# Patient Record
Sex: Male | Born: 1990
Health system: Southern US, Community
[De-identification: ages and names within clinical notes are randomized; demographics above are authoritative.]

## PROBLEM LIST (undated history)

## (undated) DIAGNOSIS — K219 Gastro-esophageal reflux disease without esophagitis: Secondary | ICD-10-CM

## (undated) DIAGNOSIS — I10 Essential (primary) hypertension: Secondary | ICD-10-CM

## (undated) DIAGNOSIS — E039 Hypothyroidism, unspecified: Secondary | ICD-10-CM

## (undated) HISTORY — DX: Essential (primary) hypertension: I10

## (undated) HISTORY — DX: Gastro-esophageal reflux disease without esophagitis: K21.9

## (undated) HISTORY — DX: Hypothyroidism, unspecified: E03.9

---

## 2014-01-02 ENCOUNTER — Encounter (HOSPITAL_BASED_OUTPATIENT_CLINIC_OR_DEPARTMENT_OTHER): Payer: Self-pay | Admitting: Emergency Medicine

## 2014-01-02 ENCOUNTER — Emergency Department (HOSPITAL_BASED_OUTPATIENT_CLINIC_OR_DEPARTMENT_OTHER)
Admission: EM | Admit: 2014-01-02 | Discharge: 2014-01-02 | Disposition: A | Discharge status: Home / Self Care | Payer: Worker's Compensation | Attending: Emergency Medicine | Admitting: Emergency Medicine

## 2014-01-02 ENCOUNTER — Emergency Department (HOSPITAL_BASED_OUTPATIENT_CLINIC_OR_DEPARTMENT_OTHER): Payer: Worker's Compensation

## 2014-01-02 DIAGNOSIS — Z4689 Encounter for fitting and adjustment of other specified devices: Secondary | ICD-10-CM | POA: Insufficient documentation

## 2014-01-02 DIAGNOSIS — Z79899 Other long term (current) drug therapy: Secondary | ICD-10-CM | POA: Diagnosis not present

## 2014-01-02 DIAGNOSIS — M79609 Pain in unspecified limb: Secondary | ICD-10-CM | POA: Diagnosis present

## 2014-01-02 DIAGNOSIS — S86891A Other injury of other muscle(s) and tendon(s) at lower leg level, right leg, initial encounter: Secondary | ICD-10-CM

## 2014-01-02 MED ORDER — NAPROXEN 500 MG PO TABS: 500.0000 mg | ORAL_TABLET | Freq: Two times a day (BID) | ORAL | Status: DC

## 2014-01-02 NOTE — ED Provider Notes (Signed)
CSN: [id number]     Arrival date & time 01/02/14  8295 History   First MD Initiated Contact with Patient 01/02/14 1029     Chief Complaint  Patient presents with  . Foot Pain    HPI Pt presents to the ED with pain in his foot that started yesterday.  No injury.  The pain is short on the top of his midfoot towards his toes.  Increases with bearing weight.  No fever.   No recent increase in activity.  No fevers or swelling. History reviewed. No pertinent past medical history. History reviewed. No pertinent past surgical history. No family history on file. History  Substance Use Topics  . Smoking status: Never Smoker   . Smokeless tobacco: Not on file  . Alcohol Use: Yes    Review of Systems  All other systems reviewed and are negative.     Allergies  Review of patient's allergies indicates no known allergies.  Home Medications   Prior to Admission medications   Medication Sig Start Date End Date Taking? Authorizing Provider  Multiple Vitamins-Minerals (MULTIVITAMIN WITH MINERALS) tablet Take 1 tablet by mouth daily.   Yes Historical Provider, MD  naproxen (NAPROSYN) 500 MG tablet Take 1 tablet (500 mg total) by mouth 2 (two) times daily. 01/02/14   Linwood Dibbles, MD   BP 139/74  Pulse 63  Temp(Src) 97.8 F (36.6 C) (Oral)  Resp 18  Ht  (1.778 m)  Wt 170 lb (77.111 kg)  BMI 24.39 kg/m2  SpO2 100% Physical Exam  Nursing note and vitals reviewed. Constitutional: He appears well-developed and well-nourished. No distress.  HENT:  Head: Normocephalic and atraumatic.  Right Ear: External ear normal.  Left Ear: External ear normal.  Eyes: Conjunctivae are normal. Right eye exhibits no discharge. Left eye exhibits no discharge. No scleral icterus.  Neck: Neck supple. No tracheal deviation present.  Cardiovascular: Normal rate.   Pulmonary/Chest: Effort normal. No stridor. No respiratory distress.  Musculoskeletal: He exhibits no edema.       Left foot: He exhibits bony  tenderness. He exhibits no swelling and no deformity.       Feet:  Mild ttp anterior aspect of shin adjacent to tibia bone, no erythema   Neurological: He is alert. Cranial nerve deficit: no gross deficits.  Skin: Skin is warm and dry. No rash noted.  Psychiatric: He has a normal mood and affect.    ED Course  Procedures (including critical care time) Labs Review Labs Reviewed - No data to display  Imaging Review Dg Ankle Complete Left  01/02/2014   CLINICAL DATA:  Left ankle pain. No trauma. Increases with weight-bearing.  EXAM: LEFT ANKLE COMPLETE - 3+ VIEW  COMPARISON:  None.  FINDINGS: There is no evidence of fracture, dislocation, or joint effusion. There is no evidence of arthropathy or other focal bone abnormality. Soft tissues are unremarkable.  IMPRESSION: Negative.   Electronically Signed   By: Annia Belt M.D.   On: 01/02/2014 10:55     EKG Interpretation None      MDM   Final diagnoses:  Shin splints, right, initial encounter    No stress fracture noted on xray.  Symptoms do increase with dorsiflexion of foot.  Suspect shin splints as the cause of his pain.  Will rx nsaids.  Rest.  Follow up sports med as needed    Linwood Dibbles, MD 01/02/14 1119

## 2014-01-02 NOTE — ED Notes (Signed)
Pt discharged to home NAD.  

## 2014-01-02 NOTE — ED Notes (Signed)
Patient c/o L foot pain, shooting pain when he tries to bear weight, no injury that he is not aware of any injury

## 2014-01-02 NOTE — Discharge Instructions (Signed)
Shin Splints Shin splints is a painful condition that is felt on the shinbone or in the muscles on either side of the bone (front of your lower leg). Shin splints happen when physical activities, such as sports or other demanding exercise, leads to inflammation of the muscles, tendons, and the thin layer that covers the shinbone.  CAUSES   Overuse of muscles.  Repetitive activities.  Flat feet or rigid arches. Activities that could contribute to shin splints include:  A sudden increase in exercise time.  Starting a new, demanding activity.  Running up hills or long distances.  Playing sports with sudden starts and stops.  A poor warm up.  Old or worn-out shoes. SYMPTOMS   Pain on the front of the leg.  Pain while exercising or at rest. DIAGNOSIS  Your caregiver will diagnose shin splints from a history of your symptoms and a physical exam. You may be observed as you walk or run. X-ray exams or further testing may be needed to rule out other problems, such as a stress fracture, which also causes lower leg pain. TREATMENT  Your caregiver may decide on the treatment based on your age, history, health, and how bad the pain is. Most cases of shin splints can be managed by one or more of the following:  Resting.  Reducing the length and intensity of your exercise.  Stopping the activity that causes shin pain.  Taking medicines to control the inflammation.  Icing, massaging, stretching, and strengthening the affected area.  Getting shoes with rigid heels, shock absorption, and a good arch support. HOME CARE INSTRUCTIONS   Resume activity steadily or as directed by your caregiver.  Restart your exercise sessions with non-weight-bearing exercises, such as cycling or swimming.  Stop running if the pain returns.  Warm up properly before exercising.  Run on a level and fairly firm surface.  Gradually change the intensity of an exercise.  Limit increases in running  distance by no more than 5 to 10% weekly. This means if you are running 5 miles, you can only increase your run by 1/2 a mile at a time.  Change your athletic shoes every 6 months, or every 350 to 450 miles. SEEK MEDICAL CARE IF:   Symptoms continue or worsen even after treatment.  The location, intensity, or type of pain changes over time. SEEK IMMEDIATE MEDICAL CARE IF:   You have severe pain.  You have trouble walking. MAKE SURE YOU:  Understand these instructions.  Will watch your condition.  Will get help right away if you are not doing well or get worse. Document Released: 03/29/2000 Document Revised: 06/24/2011 Document Reviewed: 09/16/2010 Exit[employer] Patient Information 2015 [employer]. This information is not intended to replace advice given to you by your health care provider. Make sure you discuss any questions you have with your health care provider.  

## 2014-02-08 DIAGNOSIS — E785 Hyperlipidemia, unspecified: Secondary | ICD-10-CM | POA: Insufficient documentation

## 2014-10-26 IMAGING — CR DG ANKLE COMPLETE 3+V*L*
3 series · 3 of 3 positions shown · non-contrast
Comparison: None.

CLINICAL DATA: Left ankle pain. No trauma. Increases with
weight-bearing.

EXAM:
LEFT ANKLE COMPLETE - 3+ VIEW

[t ankle joint ap left]
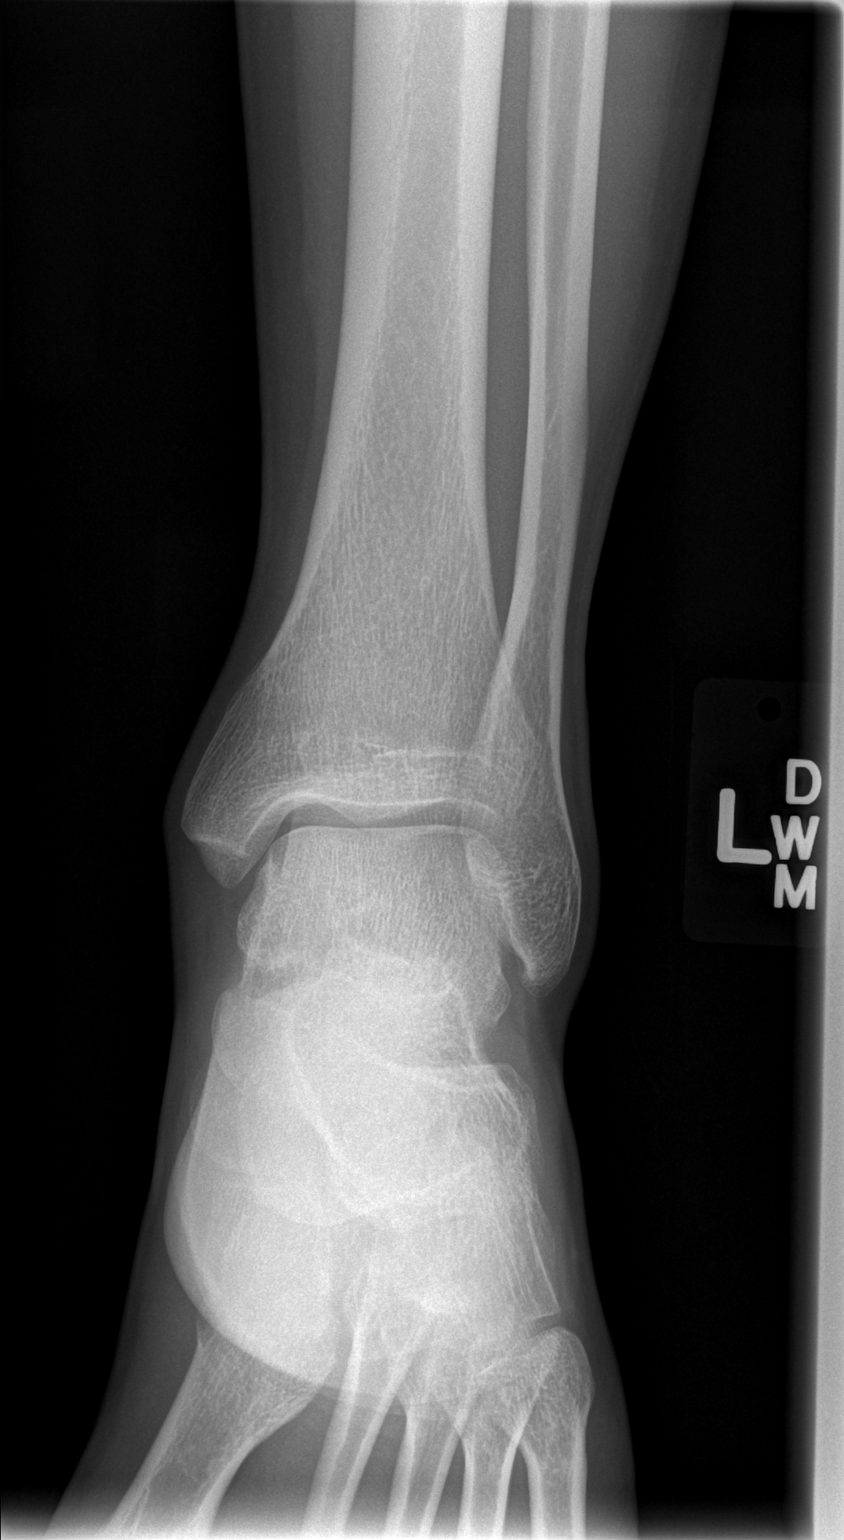

[t ankle joint oblique left]
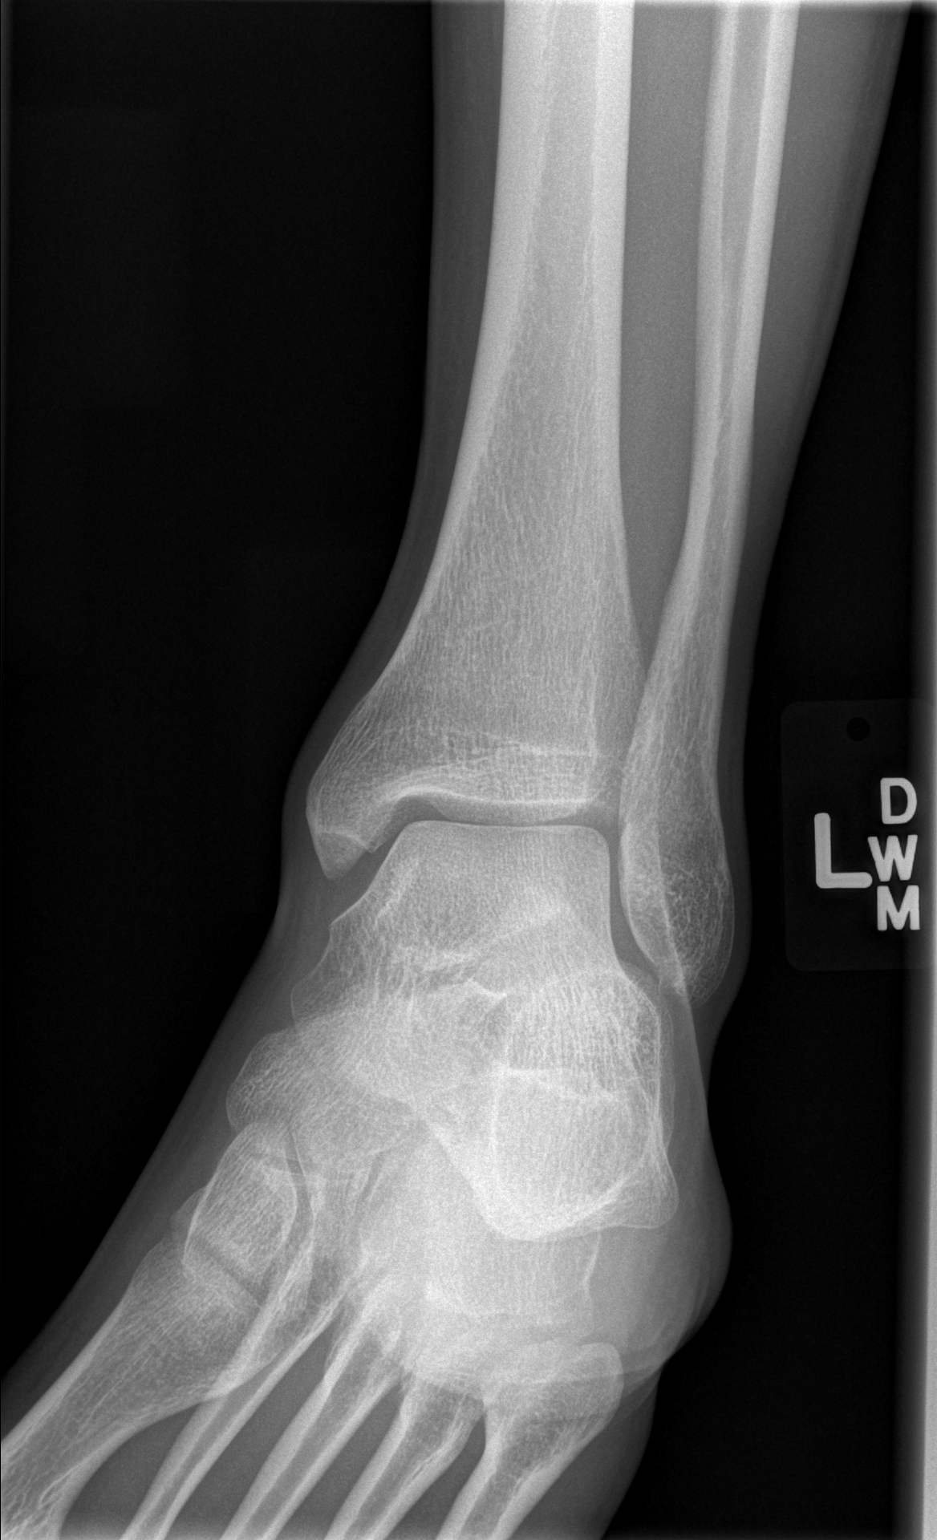

[t ankle joint lat left]
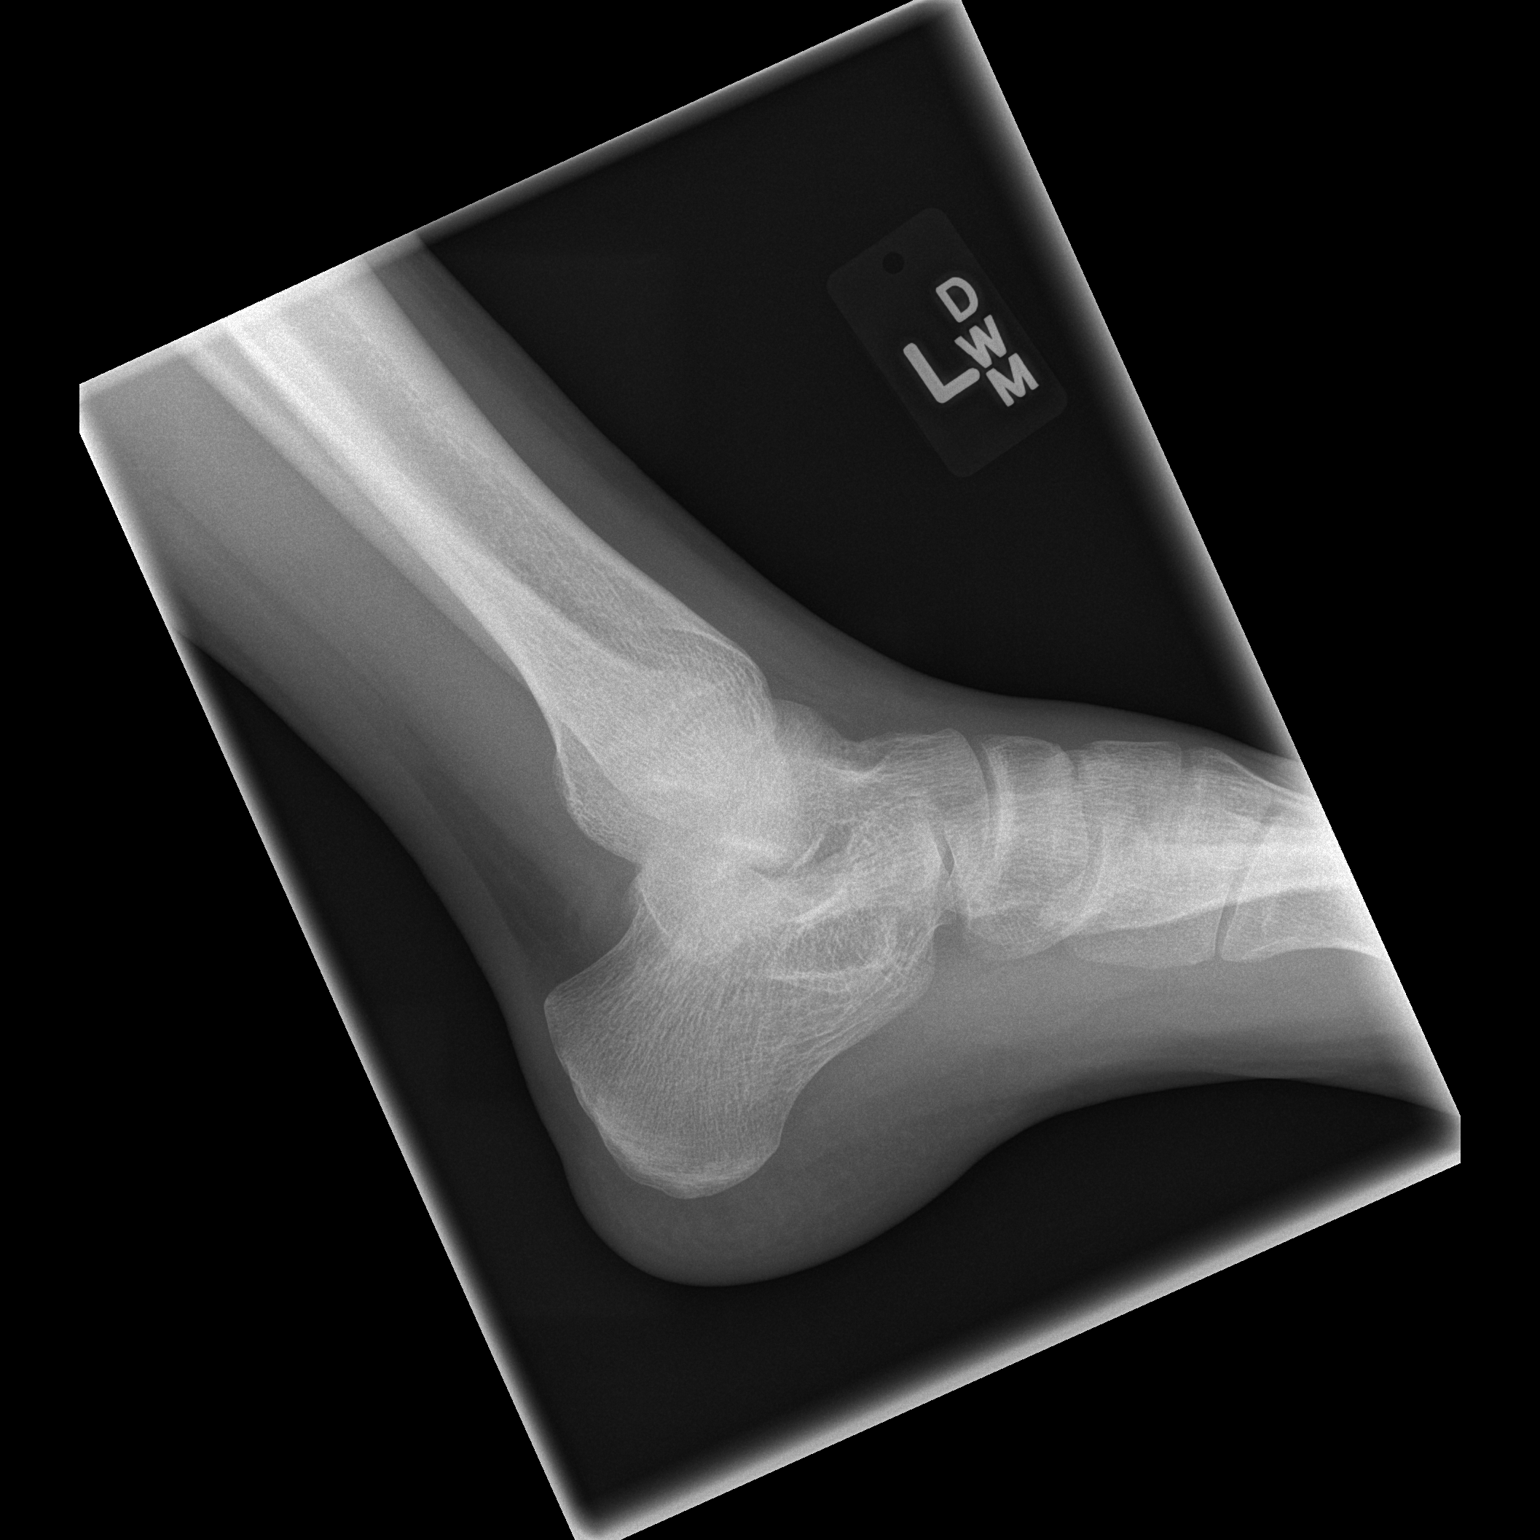

[3 of 3 positions shown; findings below may reference images not displayed]

FINDINGS: There is no evidence of fracture, dislocation, or joint effusion.
There is no evidence of arthropathy or other focal bone abnormality.
Soft tissues are unremarkable.
IMPRESSION: Negative.

## 2016-07-11 DIAGNOSIS — K219 Gastro-esophageal reflux disease without esophagitis: Secondary | ICD-10-CM | POA: Diagnosis not present

## 2016-07-11 DIAGNOSIS — E034 Atrophy of thyroid (acquired): Secondary | ICD-10-CM | POA: Diagnosis not present

## 2016-08-30 DIAGNOSIS — R945 Abnormal results of liver function studies: Secondary | ICD-10-CM | POA: Diagnosis not present

## 2016-08-30 DIAGNOSIS — J018 Other acute sinusitis: Secondary | ICD-10-CM | POA: Diagnosis not present

## 2017-01-08 DIAGNOSIS — J0100 Acute maxillary sinusitis, unspecified: Secondary | ICD-10-CM | POA: Diagnosis not present

## 2017-02-19 DIAGNOSIS — K219 Gastro-esophageal reflux disease without esophagitis: Secondary | ICD-10-CM | POA: Diagnosis not present

## 2017-02-19 DIAGNOSIS — Z23 Encounter for immunization: Secondary | ICD-10-CM | POA: Diagnosis not present

## 2017-02-19 DIAGNOSIS — I10 Essential (primary) hypertension: Secondary | ICD-10-CM | POA: Diagnosis not present

## 2017-02-19 DIAGNOSIS — E034 Atrophy of thyroid (acquired): Secondary | ICD-10-CM | POA: Diagnosis not present

## 2017-05-01 DIAGNOSIS — I10 Essential (primary) hypertension: Secondary | ICD-10-CM | POA: Diagnosis not present

## 2017-05-13 DIAGNOSIS — I10 Essential (primary) hypertension: Secondary | ICD-10-CM | POA: Diagnosis not present

## 2017-06-05 DIAGNOSIS — J018 Other acute sinusitis: Secondary | ICD-10-CM | POA: Diagnosis not present

## 2017-06-06 DIAGNOSIS — I10 Essential (primary) hypertension: Secondary | ICD-10-CM | POA: Diagnosis not present

## 2017-06-06 DIAGNOSIS — I517 Cardiomegaly: Secondary | ICD-10-CM | POA: Diagnosis not present

## 2017-06-11 ENCOUNTER — Encounter: Payer: Self-pay | Admitting: Cardiology

## 2017-06-11 DIAGNOSIS — K219 Gastro-esophageal reflux disease without esophagitis: Secondary | ICD-10-CM | POA: Insufficient documentation

## 2017-06-11 DIAGNOSIS — I10 Essential (primary) hypertension: Secondary | ICD-10-CM | POA: Insufficient documentation

## 2017-06-11 DIAGNOSIS — E034 Atrophy of thyroid (acquired): Secondary | ICD-10-CM | POA: Insufficient documentation

## 2017-06-18 ENCOUNTER — Encounter: Payer: Self-pay | Admitting: Cardiology

## 2017-06-18 ENCOUNTER — Ambulatory Visit (INDEPENDENT_AMBULATORY_CARE_PROVIDER_SITE_OTHER): Payer: 59 | Admitting: Cardiology

## 2017-06-18 VITALS — BP 124/80 | HR 91 | Ht 70.0 in | Wt 195.0 lb

## 2017-06-18 DIAGNOSIS — E785 Hyperlipidemia, unspecified: Secondary | ICD-10-CM

## 2017-06-18 DIAGNOSIS — I10 Essential (primary) hypertension: Secondary | ICD-10-CM | POA: Diagnosis not present

## 2017-06-18 DIAGNOSIS — E034 Atrophy of thyroid (acquired): Secondary | ICD-10-CM

## 2017-06-18 DIAGNOSIS — K219 Gastro-esophageal reflux disease without esophagitis: Secondary | ICD-10-CM

## 2017-06-18 DIAGNOSIS — R9439 Abnormal result of other cardiovascular function study: Secondary | ICD-10-CM | POA: Diagnosis not present

## 2017-06-18 NOTE — Progress Notes (Signed)
Cardiology Office Note:    Date:  06/18/2017   ID:  Carlos Smith, DOB 06-01-1990, MRN [id number]  PCP:  System, Pcp Not In  Cardiologist:  Garwin Brothers, MD   Referring MD: Gus Height, PA-C    ASSESSMENT:    1. Abnormal stress test   2. Atrophy of thyroid   3. Gastro-esophageal reflux disease without esophagitis   4. Essential hypertension    PLAN:    In order of problems listed above:  1. I reassured the patient about my findings today.  Lifestyle modifications for blood pressure issues were discussed.  I am not sure whether he has an element of high blood pressure but I mentioned to him to keep a track of his blood pressures at home in his normal environment and he is agreeable to this.  Fortunately I do not see any endorgan damage issues such as left ventricular hypertrophy and I am happy about it and and I ran it by him to make him feel good. 2. In view of abnormal stress test I will do a exercise stress echo to make sure that all is well with this gentleman. 3. For risk stratification we will do blood work including TSH and lipid profile will be done today as he is fasting. 4. Will be seen in follow-up appointment in 1 month or earlier if he has any concerns.  He knows to go to the nearest emergency room for any significant concerns.   Medication Adjustments/Labs and Tests Ordered: Current medicines are reviewed at length with the patient today.  Concerns regarding medicines are outlined above.  No orders of the defined types were placed in this encounter.  No orders of the defined types were placed in this encounter.    History of Present Illness:    Carlos Smith is a 27 y.o. male who is being seen today for the evaluation of essential hypertension and chest pain patient is a at the request of Gus Height, [place].  A pleasant 27 year old male with no significant past medical history.  Recently he has checked his blood pressure at time or 2 and mentions to me that  it might have been elevated.  This was under unusual circumstances mostly at the doctor's office.  He is fairly stressed about this.  Because of her family history was referred for stress testing which had abnormal EKG findings.  He denies any chest pain orthopnea or PND overall he has a knifelike stabbing sensation occasionally unrelated to exertion.  Because of all these issues he is pretty worried and concerned.  No syncope.  At the time of my evaluation, the patient is alert awake oriented and in no distress.  He feels dizzy at times.  Past Medical History:  Diagnosis Date  . Gastroesophageal reflux disease   . Hypertension   . Hypothyroidism     History reviewed. No pertinent surgical history.  Current Medications: Current Meds  Medication Sig  . levothyroxine (SYNTHROID, LEVOTHROID) 25 MCG tablet Take 25 mcg by mouth daily.  [name] lisinopril (PRINIVIL,ZESTRIL) 5 MG tablet Take 5 mg by mouth daily.  . Multiple Vitamin (MULTIVITAMIN) capsule Take by mouth.     Allergies:   Patient has no known allergies.   Social History   Socioeconomic History  . Marital status: Single    Spouse name: None  . Number of children: None  . Years of education: None  . Highest education level: None  Social Needs  . Financial resource strain: None  .  Food insecurity - worry: None  . Food insecurity - inability: None  . Transportation needs - medical: None  . Transportation needs - non-medical: None  Occupational History  . None  Tobacco Use  . Smoking status: Never Smoker  . Smokeless tobacco: Never Used  Substance and Sexual Activity  . Alcohol use: Yes  . Drug use: No  . Sexual activity: None  Other Topics Concern  . None  Social History Narrative  . None     Family History: The patient's family history includes Cerebrovascular Accident in his father; Hypertension in his brother, father, mother, and sister; Migraines in his father; Stroke in his father.  ROS:   Please see the  history of present illness.    All other systems reviewed and are negative.  EKGs/Labs/Other Studies Reviewed:    The following studies were reviewed today: I discussed my findings with him at extensive length.  EKG reveals sinus rhythm and nonspecific ST-T changes.   Recent Labs: No results found for requested labs within last 8760 hours.  Recent Lipid Panel No results found for: CHOL, TRIG, HDL, CHOLHDL, VLDL, LDLCALC, LDLDIRECT  Physical Exam:    VS:  BP 124/80 (BP Location: Right Arm, Patient Position: Sitting, Cuff Size: Normal)   Pulse 91   Ht 5\' 10"  (1.778 m)   Wt 195 lb (88.5 kg)   SpO2 98%   BMI 27.98 kg/m     Wt Readings from Last 3 Encounters:  06/18/17 195 lb (88.5 kg)  01/02/14 170 lb (77.1 kg)     GEN: Patient is in no acute distress HEENT: Normal NECK: No JVD; No carotid bruits LYMPHATICS: No lymphadenopathy CARDIAC: S1 S2 regular, 2/6 systolic murmur at the apex. RESPIRATORY:  Clear to auscultation without rales, wheezing or rhonchi  ABDOMEN: Soft, non-tender, non-distended MUSCULOSKELETAL:  No edema; No deformity  SKIN: Warm and dry NEUROLOGIC:  Alert and oriented x 3 PSYCHIATRIC:  Normal affect    Signed, Garwin Brothersajan R Revankar, MD  06/18/2017 9:38 AM    [facility]

## 2017-06-18 NOTE — Patient Instructions (Addendum)
Medication Instructions:  Your physician recommends that you continue on your current medications as directed. Please refer to the Current Medication list given to you today.  Labwork: Your physician recommends that you have the following labs drawn: BMP, CBC, TSH, MAG, liver and lipid panel  Testing/Procedures: Your physician has requested that you have a stress echocardiogram. For further information please visit [email or web address]. Please follow instruction sheet as given.  Follow-Up: Your physician recommends that you schedule a follow-up appointment in: 1 month  Any Other Special Instructions Will Be Listed Below (If Applicable).     If you need a refill on your cardiac medications before your next appointment, please call your pharmacy.   [facility]  Garey HamAshley A, RN, BSN

## 2017-06-19 LAB — BASIC METABOLIC PANEL
BUN/Creatinine Ratio: 13 (ref 9–20)
BUN: 8 mg/dL (ref 6–20)
CO2: 27 mmol/L (ref 20–29)
CREATININE: 0.63 mg/dL — AB (ref 0.76–1.27)
Calcium: 9.9 mg/dL (ref 8.7–10.2)
Chloride: 100 mmol/L (ref 96–106)
GFR calc Af Amer: 157 mL/min/{1.73_m2} (ref >59)
GFR calc non Af Amer: 136 mL/min/{1.73_m2} (ref >59)
Glucose: 83 mg/dL (ref 65–99)
Potassium: 4.7 mmol/L (ref 3.5–5.2)
Sodium: 141 mmol/L (ref 134–144)

## 2017-06-19 LAB — HEPATIC FUNCTION PANEL
ALBUMIN: 4.6 g/dL (ref 3.5–5.5)
ALK PHOS: 87 IU/L (ref 39–117)
ALT: 27 IU/L (ref 0–44)
AST: 17 IU/L (ref 0–40)
BILIRUBIN TOTAL: 0.4 mg/dL (ref 0.0–1.2)
BILIRUBIN, DIRECT: 0.13 mg/dL (ref 0.00–0.40)
Total Protein: 7.4 g/dL (ref 6.0–8.5)

## 2017-06-19 LAB — CBC WITH DIFFERENTIAL/PLATELET
BASOS ABS: 0 10*3/uL (ref 0.0–0.2)
Basos: 0 %
EOS (ABSOLUTE): 0.1 10*3/uL (ref 0.0–0.4)
Eos: 1 %
HEMOGLOBIN: 15.6 g/dL (ref 13.0–17.7)
Hematocrit: 46.1 % (ref 37.5–51.0)
Immature Grans (Abs): 0.1 10*3/uL (ref 0.0–0.1)
Immature Granulocytes: 1 %
Lymphocytes Absolute: 1.5 10*3/uL (ref 0.7–3.1)
Lymphs: 18 %
MCH: 30.5 pg (ref 26.6–33.0)
MCHC: 33.8 g/dL (ref 31.5–35.7)
MCV: 90 fL (ref 79–97)
MONOCYTES: 9 %
Monocytes Absolute: 0.7 10*3/uL (ref 0.1–0.9)
Neutrophils Absolute: 5.8 10*3/uL (ref 1.4–7.0)
Neutrophils: 71 %
PLATELETS: 245 10*3/uL (ref 150–379)
RBC: 5.12 x10E6/uL (ref 4.14–5.80)
RDW: 13.2 % (ref 12.3–15.4)
WBC: 8.1 10*3/uL (ref 3.4–10.8)

## 2017-06-19 LAB — MAGNESIUM: Magnesium: 2 mg/dL (ref 1.6–2.3)

## 2017-06-19 LAB — LIPID PANEL
CHOLESTEROL TOTAL: 163 mg/dL (ref 100–199)
Chol/HDL Ratio: 4.3 ratio (ref 0.0–5.0)
HDL: 38 mg/dL — ABNORMAL LOW (ref >39)
LDL Calculated: 97 mg/dL (ref 0–99)
Triglycerides: 140 mg/dL (ref 0–149)
VLDL Cholesterol Cal: 28 mg/dL (ref 5–40)

## 2017-06-19 LAB — TSH: TSH: 1.56 u[IU]/mL (ref 0.450–4.500)

## 2017-07-08 ENCOUNTER — Ambulatory Visit (HOSPITAL_BASED_OUTPATIENT_CLINIC_OR_DEPARTMENT_OTHER)
Admission: RE | Admit: 2017-07-08 | Discharge: 2017-07-08 | Disposition: A | Discharge status: Home / Self Care | Payer: 59 | Source: Ambulatory Visit | Attending: Cardiology | Admitting: Cardiology

## 2017-07-08 DIAGNOSIS — R9439 Abnormal result of other cardiovascular function study: Secondary | ICD-10-CM | POA: Insufficient documentation

## 2017-07-08 NOTE — Progress Notes (Signed)
Echocardiogram Echocardiogram Stress Test has been performed.  Dorothey BasemanReel, Bexton Haak M 07/08/2017, 11:47 AM

## 2017-07-16 ENCOUNTER — Other Ambulatory Visit (HOSPITAL_BASED_OUTPATIENT_CLINIC_OR_DEPARTMENT_OTHER): Payer: Self-pay

## 2017-07-18 ENCOUNTER — Ambulatory Visit: Payer: 59 | Admitting: Cardiology

## 2017-07-18 DIAGNOSIS — R0989 Other specified symptoms and signs involving the circulatory and respiratory systems: Secondary | ICD-10-CM

## 2017-09-17 DIAGNOSIS — E034 Atrophy of thyroid (acquired): Secondary | ICD-10-CM | POA: Diagnosis not present

## 2017-09-17 DIAGNOSIS — I10 Essential (primary) hypertension: Secondary | ICD-10-CM | POA: Diagnosis not present

## 2017-09-17 DIAGNOSIS — K219 Gastro-esophageal reflux disease without esophagitis: Secondary | ICD-10-CM | POA: Diagnosis not present

## 2017-10-29 DIAGNOSIS — J060 Acute laryngopharyngitis: Secondary | ICD-10-CM | POA: Diagnosis not present

## 2017-12-17 DIAGNOSIS — J018 Other acute sinusitis: Secondary | ICD-10-CM | POA: Diagnosis not present

## 2018-02-17 DIAGNOSIS — J018 Other acute sinusitis: Secondary | ICD-10-CM | POA: Diagnosis not present

## 2018-03-19 DIAGNOSIS — E034 Atrophy of thyroid (acquired): Secondary | ICD-10-CM | POA: Diagnosis not present

## 2018-03-19 DIAGNOSIS — Z23 Encounter for immunization: Secondary | ICD-10-CM | POA: Diagnosis not present

## 2018-03-19 DIAGNOSIS — K219 Gastro-esophageal reflux disease without esophagitis: Secondary | ICD-10-CM | POA: Diagnosis not present

## 2018-03-19 DIAGNOSIS — I10 Essential (primary) hypertension: Secondary | ICD-10-CM | POA: Diagnosis not present

## 2018-03-26 DIAGNOSIS — J342 Deviated nasal septum: Secondary | ICD-10-CM | POA: Diagnosis not present

## 2018-03-26 DIAGNOSIS — J3489 Other specified disorders of nose and nasal sinuses: Secondary | ICD-10-CM | POA: Diagnosis not present

## 2018-03-26 DIAGNOSIS — R0981 Nasal congestion: Secondary | ICD-10-CM | POA: Diagnosis not present

## 2018-04-20 DIAGNOSIS — J00 Acute nasopharyngitis [common cold]: Secondary | ICD-10-CM | POA: Diagnosis not present

## 2018-04-29 DIAGNOSIS — J329 Chronic sinusitis, unspecified: Secondary | ICD-10-CM | POA: Diagnosis not present

## 2018-05-05 DIAGNOSIS — J343 Hypertrophy of nasal turbinates: Secondary | ICD-10-CM | POA: Diagnosis not present

## 2018-05-05 DIAGNOSIS — R0981 Nasal congestion: Secondary | ICD-10-CM | POA: Diagnosis not present

## 2018-05-05 DIAGNOSIS — J342 Deviated nasal septum: Secondary | ICD-10-CM | POA: Diagnosis not present

## 2018-05-16 HISTORY — PX: NASAL SEPTUM SURGERY: SHX37

## 2018-05-20 DIAGNOSIS — J342 Deviated nasal septum: Secondary | ICD-10-CM | POA: Diagnosis not present

## 2018-05-20 DIAGNOSIS — R0981 Nasal congestion: Secondary | ICD-10-CM | POA: Diagnosis not present

## 2018-05-20 DIAGNOSIS — J343 Hypertrophy of nasal turbinates: Secondary | ICD-10-CM | POA: Diagnosis not present

## 2018-06-03 DIAGNOSIS — J320 Chronic maxillary sinusitis: Secondary | ICD-10-CM | POA: Diagnosis not present

## 2018-06-03 DIAGNOSIS — J343 Hypertrophy of nasal turbinates: Secondary | ICD-10-CM | POA: Diagnosis not present

## 2018-06-03 DIAGNOSIS — R0981 Nasal congestion: Secondary | ICD-10-CM | POA: Diagnosis not present

## 2018-06-03 DIAGNOSIS — J322 Chronic ethmoidal sinusitis: Secondary | ICD-10-CM | POA: Diagnosis not present

## 2018-06-03 DIAGNOSIS — J3489 Other specified disorders of nose and nasal sinuses: Secondary | ICD-10-CM | POA: Diagnosis not present

## 2018-06-03 DIAGNOSIS — J329 Chronic sinusitis, unspecified: Secondary | ICD-10-CM | POA: Diagnosis not present

## 2018-06-03 DIAGNOSIS — J342 Deviated nasal septum: Secondary | ICD-10-CM | POA: Diagnosis not present

## 2018-06-04 DIAGNOSIS — J329 Chronic sinusitis, unspecified: Secondary | ICD-10-CM | POA: Diagnosis not present

## 2018-06-04 DIAGNOSIS — J342 Deviated nasal septum: Secondary | ICD-10-CM | POA: Diagnosis not present

## 2018-06-11 DIAGNOSIS — J329 Chronic sinusitis, unspecified: Secondary | ICD-10-CM | POA: Diagnosis not present

## 2018-06-18 DIAGNOSIS — J329 Chronic sinusitis, unspecified: Secondary | ICD-10-CM | POA: Diagnosis not present

## 2018-07-02 DIAGNOSIS — Z8709 Personal history of other diseases of the respiratory system: Secondary | ICD-10-CM | POA: Diagnosis not present

## 2018-07-02 DIAGNOSIS — R05 Cough: Secondary | ICD-10-CM | POA: Diagnosis not present

## 2018-07-02 DIAGNOSIS — J329 Chronic sinusitis, unspecified: Secondary | ICD-10-CM | POA: Diagnosis not present

## 2018-07-06 DIAGNOSIS — J018 Other acute sinusitis: Secondary | ICD-10-CM | POA: Diagnosis not present

## 2019-08-31 ENCOUNTER — Encounter: Payer: Self-pay | Admitting: Legal Medicine

## 2019-08-31 ENCOUNTER — Telehealth (INDEPENDENT_AMBULATORY_CARE_PROVIDER_SITE_OTHER): Payer: Commercial Managed Care - PPO | Admitting: Legal Medicine

## 2019-08-31 DIAGNOSIS — J019 Acute sinusitis, unspecified: Secondary | ICD-10-CM | POA: Insufficient documentation

## 2019-08-31 DIAGNOSIS — J0100 Acute maxillary sinusitis, unspecified: Secondary | ICD-10-CM | POA: Diagnosis not present

## 2019-08-31 MED ORDER — AMOXICILLIN-POT CLAVULANATE 875-125 MG PO TABS: 1.0000 | ORAL_TABLET | Freq: Two times a day (BID) | ORAL | 0 refills | Status: DC

## 2019-08-31 MED ORDER — PREDNISONE 5 MG PO TABS: 5.0000 mg | ORAL_TABLET | Freq: Every day | ORAL | 0 refills | Status: DC

## 2019-08-31 NOTE — Assessment & Plan Note (Signed)
Patient is sick with congestion and sinus pain.  No fever or chills, treat with augmentin and prednisone pack.  Follow up if needed

## 2019-08-31 NOTE — Progress Notes (Signed)
Virtual Visit via Telephone Note   This visit type was conducted due to national recommendations for restrictions regarding the COVID-19 Pandemic (e.g. social distancing) in an effort to limit this patient's exposure and mitigate transmission in our community.  Due to his co-morbid illnesses, this patient is at least at moderate risk for complications without adequate follow up.  This format is felt to be most appropriate for this patient at this time.  The patient did not have access to video technology/had technical difficulties with video requiring transitioning to audio format only (telephone).  All issues noted in this document were discussed and addressed.  No physical exam could be performed with this format.  Patient verbally consented to a telehealth visit.   Date:  08/31/2019   ID:  Carlos Smith, DOB 08-13-1990, MRN [id number]  Patient Location: Home Provider Location: Office  PCP:  Lillard Anes, MD   Evaluation Performed:  New Patient Evaluation  Chief Complaint:  Sinus pain  History of Present Illness:    Carlos Smith is a 29 y.o. male with 3 day history of sinus pain and congestion. No fever or chills.  The patient does not have symptoms concerning for COVID-19 infection (fever, chills, cough, or new shortness of breath).    Past Medical History:  Diagnosis Date  . Gastroesophageal reflux disease   . Hypertension   . Hypothyroidism     History reviewed. No pertinent surgical history.  Family History  Problem Relation Age of Onset  . Hypertension Mother   . Hypertension Father   . Migraines Father   . Cerebrovascular Accident Father   . Stroke Father   . Hypertension Sister   . Hypertension Brother     Social History   Socioeconomic History  . Marital status: Married    Spouse name: Not on file  . Number of children: Not on file  . Years of education: Not on file  . Highest education level: Not on file  Occupational History  . Not on file    Tobacco Use  . Smoking status: Never Smoker  . Smokeless tobacco: Never Used  Substance and Sexual Activity  . Alcohol use: Yes  . Drug use: No  . Sexual activity: Not on file  Other Topics Concern  . Not on file  Social History Narrative  . Not on file   Social Determinants of Health   Financial Resource Strain:   . Difficulty of Paying Living Expenses:   Food Insecurity:   . Worried About Charity fundraiser in the Last Year:   . Arboriculturist in the Last Year:   Transportation Needs:   . Film/video editor (Medical):   [name] Lack of Transportation (Non-Medical):   Physical Activity:   . Days of Exercise per Week:   . Minutes of Exercise per Session:   Stress:   . Feeling of Stress :   Social Connections:   . Frequency of Communication with Friends and Family:   . Frequency of Social Gatherings with Friends and Family:   . Attends Religious Services:   . Active Member of Clubs or Organizations:   . Attends Archivist Meetings:   [name] Marital Status:   Intimate Partner Violence:   . Fear of Current or Ex-Partner:   . Emotionally Abused:   [name] Physically Abused:   . Sexually Abused:     Outpatient Medications Prior to Visit  Medication Sig Dispense Refill  . levothyroxine (SYNTHROID, LEVOTHROID) 25 MCG  tablet Take 25 mcg by mouth daily.    [name] lisinopril (PRINIVIL,ZESTRIL) 5 MG tablet Take 5 mg by mouth daily.    . Multiple Vitamin (MULTIVITAMIN) capsule Take by mouth.    [name] lisinopril (ZESTRIL) 10 MG tablet Take 10 mg by mouth daily.    . montelukast (SINGULAIR) 10 MG tablet Take 10 mg by mouth daily.     No facility-administered medications prior to visit.   .med Allergies:   Patient has no known allergies.   Social History   Tobacco Use  . Smoking status: Never Smoker  . Smokeless tobacco: Never Used  Substance Use Topics  . Alcohol use: Yes  . Drug use: No     Review of Systems  Constitutional: Negative.   HENT: Positive for congestion and  sinus pain.   Eyes: Negative.   Respiratory: Positive for cough.   Cardiovascular: Negative.   Gastrointestinal: Negative.   Genitourinary: Negative.   Musculoskeletal: Negative.      Labs/Other Tests and Data Reviewed:    Recent Labs: No results found for requested labs within last 8760 hours.   Recent Lipid Panel Lab Results  Component Value Date/Time   CHOL 163 06/18/2017 09:53 AM   TRIG 140 06/18/2017 09:53 AM   HDL 38 (L) 06/18/2017 09:53 AM   CHOLHDL 4.3 06/18/2017 09:53 AM   LDLCALC 97 06/18/2017 09:53 AM    Wt Readings from Last 3 Encounters:  06/18/17 195 lb (88.5 kg)  01/02/14 170 lb (77.1 kg)     Objective:    Vital Signs:  There were no vitals taken for this visit.   Physical Exam telephone visit  ASSESSMENT & PLAN:   There are no diagnoses linked to this encounter.  No orders of the defined types were placed in this encounter.  Acute sinusitis Patient is sick with congestion and sinus pain.  No fever or chills, treat with augmentin and prednisone pack.  Follow up if needed  Meds ordered this encounter  Medications  . amoxicillin-clavulanate (AUGMENTIN) 875-125 MG tablet    Sig: Take 1 tablet by mouth 2 (two) times daily.    Dispense:  20 tablet    Refill:  0  . predniSONE (DELTASONE) 5 MG tablet    Sig: Take 1 tablet (5 mg total) by mouth daily with breakfast. Take 6ills first day , then 5 pills day 2 and then cut down one pill day until gone    Dispense:  21 tablet    Refill:  0    COVID-19 Education: The signs and symptoms of COVID-19 were discussed with the patient and how to seek care for testing (follow up with PCP or arrange E-visit). The importance of social distancing was discussed today.  Time:   Today, I have spent 20 minutes with the patient with telehealth technology discussing the above problems.    Follow Up:  In Person prn  Signed, Brent Bulla, MD  08/31/2019 4:13 PM    [facility]

## 2019-10-25 ENCOUNTER — Ambulatory Visit (INDEPENDENT_AMBULATORY_CARE_PROVIDER_SITE_OTHER): Payer: Self-pay | Admitting: Legal Medicine

## 2019-10-25 ENCOUNTER — Encounter: Payer: Self-pay | Admitting: Legal Medicine

## 2019-10-25 ENCOUNTER — Other Ambulatory Visit: Payer: Self-pay

## 2019-10-25 VITALS — BP 134/60 | HR 111 | Temp 98.7°F | Resp 17 | Ht 70.0 in | Wt 185.4 lb

## 2019-10-25 DIAGNOSIS — Z113 Encounter for screening for infections with a predominantly sexual mode of transmission: Secondary | ICD-10-CM

## 2019-10-25 NOTE — Progress Notes (Signed)
Acute Office Visit  Subjective:    Patient ID: Carlos Smith, male    DOB: 1991/03/27, 29 y.o.   MRN: [id number]  Chief Complaint  Patient presents with  . STDS check    HPI Patient is in today for STD check.  He was exposed Friday.  No symptoms.  No discharge.  Scrotum felt warm. He went to a party and slept with someone new.  He is very anxious.  Past Medical History:  Diagnosis Date  . Gastroesophageal reflux disease   . Hypertension   . Hypothyroidism     History reviewed. No pertinent surgical history.  Family History  Problem Relation Age of Onset  . Hypertension Mother   . Hypertension Father   . Migraines Father   . Cerebrovascular Accident Father   . Stroke Father   . Hypertension Sister   . Hypertension Brother     Social History   Socioeconomic History  . Marital status: Married    Spouse name: Not on file  . Number of children: Not on file  . Years of education: Not on file  . Highest education level: Not on file  Occupational History  . Not on file  Tobacco Use  . Smoking status: Never Smoker  . Smokeless tobacco: Former Neurosurgeon    Types: Chew  Substance and Sexual Activity  . Alcohol use: Yes    Alcohol/week: 4.0 standard drinks    Types: 4 Cans of beer per week    Comment: twice a week  . Drug use: No  . Sexual activity: Yes    Partners: Female  Other Topics Concern  . Not on file  Social History Narrative  . Not on file   Social Determinants of Health   Financial Resource Strain:   . Difficulty of Paying Living Expenses:   Food Insecurity:   . Worried About Programme researcher, broadcasting/film/video in the Last Year:   . Barista in the Last Year:   Transportation Needs:   . Freight forwarder (Medical):   [name] Lack of Transportation (Non-Medical):   Physical Activity:   . Days of Exercise per Week:   . Minutes of Exercise per Session:   Stress:   . Feeling of Stress :   Social Connections:   . Frequency of Communication with Friends and  Family:   . Frequency of Social Gatherings with Friends and Family:   . Attends Religious Services:   . Active Member of Clubs or Organizations:   . Attends Banker Meetings:   [name] Marital Status:   Intimate Partner Violence:   . Fear of Current or Ex-Partner:   . Emotionally Abused:   [name] Physically Abused:   . Sexually Abused:     Outpatient Medications Prior to Visit  Medication Sig Dispense Refill  . levothyroxine (SYNTHROID, LEVOTHROID) 25 MCG tablet Take 25 mcg by mouth daily.    [name] lisinopril (ZESTRIL) 10 MG tablet Take 10 mg by mouth daily.    . montelukast (SINGULAIR) 10 MG tablet Take 10 mg by mouth daily.    [name] amoxicillin-clavulanate (AUGMENTIN) 875-125 MG tablet Take 1 tablet by mouth 2 (two) times daily. 20 tablet 0  . predniSONE (DELTASONE) 5 MG tablet Take 1 tablet (5 mg total) by mouth daily with breakfast. Take 6ills first day , then 5 pills day 2 and then cut down one pill day until gone 21 tablet 0   No facility-administered medications prior to visit.  No Known Allergies  Review of Systems  Constitutional: Negative.   HENT: Negative.   Eyes: Negative.   Respiratory: Negative.   Cardiovascular: Negative.   Gastrointestinal: Negative.   Genitourinary: Negative.   Musculoskeletal: Negative.   Skin: Negative.   Neurological: Negative.        Objective:    Physical Exam Vitals reviewed.  Constitutional:      Appearance: Normal appearance.  HENT:     Head: Normocephalic and atraumatic.     Right Ear: Tympanic membrane normal.     Left Ear: Tympanic membrane normal.     Nose: Nose normal.     Mouth/Throat:     Mouth: Mucous membranes are dry.  Eyes:     Extraocular Movements: Extraocular movements intact.     Conjunctiva/sclera: Conjunctivae normal.     Pupils: Pupils are equal, round, and reactive to light.  Cardiovascular:     Rate and Rhythm: Normal rate and regular rhythm.     Pulses: Normal pulses.     Heart sounds: Normal heart  sounds.  Pulmonary:     Effort: Pulmonary effort is normal.     Breath sounds: Normal breath sounds.  Abdominal:     General: Abdomen is flat. Bowel sounds are normal.     Palpations: Abdomen is soft.  Genitourinary:    Penis: Normal.      Testes: Normal.  Musculoskeletal:        General: Normal range of motion.  Skin:    Capillary Refill: Capillary refill takes less than 2 seconds.  Neurological:     General: No focal deficit present.     Mental Status: He is alert. Mental status is at baseline.     BP 134/60 (BP Location: Right Arm, Patient Position: Sitting)   Pulse (!) 111   Temp 98.7 F (37.1 C) (Temporal)   Resp 17   Ht 5\' 10"  (1.778 m)   Wt 185 lb 6.4 oz (84.1 kg)   SpO2 98%   BMI 26.60 kg/m  Wt Readings from Last 3 Encounters:  10/25/19 185 lb 6.4 oz (84.1 kg)  06/18/17 195 lb (88.5 kg)  01/02/14 170 lb (77.1 kg)    Health Maintenance Due  Topic Date Due  . Hepatitis C Screening  Never done  . COVID-19 Vaccine (1) Never done  . HIV Screening  Never done  . TETANUS/TDAP  Never done    There are no preventive care reminders to display for this patient.   Lab Results  Component Value Date   TSH 1.560 06/18/2017   Lab Results  Component Value Date   WBC 8.1 06/18/2017   HGB 15.6 06/18/2017   HCT 46.1 06/18/2017   MCV 90 06/18/2017   PLT 245 06/18/2017   Lab Results  Component Value Date   NA 141 06/18/2017   K 4.7 06/18/2017   CO2 27 06/18/2017   GLUCOSE 83 06/18/2017   BUN 8 06/18/2017   CREATININE 0.63 (L) 06/18/2017   BILITOT 0.4 06/18/2017   ALKPHOS 87 06/18/2017   AST 17 06/18/2017   ALT 27 06/18/2017   PROT 7.4 06/18/2017   ALBUMIN 4.6 06/18/2017   CALCIUM 9.9 06/18/2017   Lab Results  Component Value Date   CHOL 163 06/18/2017   Lab Results  Component Value Date   HDL 38 (L) 06/18/2017   Lab Results  Component Value Date   LDLCALC 97 06/18/2017   Lab Results  Component Value Date   TRIG 140 06/18/2017  Lab Results   Component Value Date   CHOLHDL 4.3 06/18/2017   No results found for: HGBA1C     Assessment & Plan:  1. Screening for STDs (sexually transmitted diseases) - GC/Chlamydia Probe Amp - HIV antibody (with reflex)  Patient has exposure to STD and requires testing       Follow-up: No follow-ups on file.  An After Visit Summary was printed and given to the patient.  Brent Bulla [facility] [phone]

## 2019-10-26 LAB — SYPHILIS: RPR W/REFLEX TO RPR TITER AND TREPONEMAL ANTIBODIES, TRADITIONAL SCREENING AND DIAGNOSIS ALGORITHM: RPR Ser Ql: NONREACTIVE

## 2019-10-26 LAB — HSV TYPE I/II IGG, IGMW/ REFLEX
HSV 1 Glycoprotein G Ab, IgG: <0.91 index (ref 0.00–0.90)
HSV 1 IgM: <1:10 {titer}
HSV 2 IgG, Type Spec: <0.91 index (ref 0.00–0.90)
HSV 2 IgM: <1:10 {titer}

## 2019-10-26 LAB — GC/CHLAMYDIA PROBE AMP
Chlamydia trachomatis, NAA: NEGATIVE
Neisseria Gonorrhoeae by PCR: NEGATIVE

## 2019-10-26 LAB — HIV ANTIBODY (ROUTINE TESTING W REFLEX): HIV Screen 4th Generation wRfx: NONREACTIVE

## 2019-10-26 NOTE — Progress Notes (Signed)
Gc and chlamydia negative lp

## 2019-10-26 NOTE — Progress Notes (Signed)
HIV negative lp

## 2019-11-08 ENCOUNTER — Other Ambulatory Visit: Payer: Self-pay

## 2019-11-08 MED ORDER — LEVOTHYROXINE SODIUM 25 MCG PO TABS: 25.0000 ug | ORAL_TABLET | Freq: Every day | ORAL | 2 refills | Status: DC

## 2019-11-09 ENCOUNTER — Encounter: Payer: Self-pay | Admitting: Legal Medicine

## 2019-11-09 ENCOUNTER — Telehealth (INDEPENDENT_AMBULATORY_CARE_PROVIDER_SITE_OTHER): Payer: Self-pay | Admitting: Legal Medicine

## 2019-11-09 DIAGNOSIS — J029 Acute pharyngitis, unspecified: Secondary | ICD-10-CM | POA: Insufficient documentation

## 2019-11-09 MED ORDER — AMOXICILLIN 875 MG PO TABS: 875.0000 mg | ORAL_TABLET | Freq: Two times a day (BID) | ORAL | 0 refills | Status: DC

## 2019-11-09 NOTE — Progress Notes (Signed)
Virtual Visit via Telephone Note   This visit type was conducted due to national recommendations for restrictions regarding the COVID-19 Pandemic (e.g. social distancing) in an effort to limit this patient's exposure and mitigate transmission in our community.  Due to his co-morbid illnesses, this patient is at least at moderate risk for complications without adequate follow up.  This format is felt to be most appropriate for this patient at this time.  The patient did not have access to video technology/had technical difficulties with video requiring transitioning to audio format only (telephone).  All issues noted in this document were discussed and addressed.  No physical exam could be performed with this format.  Patient verbally consented to a telehealth visit.   Date:  11/09/2019   ID:  [name], DOB May 18, 1990, MRN [id number]  Patient Location: Home Provider Location: Office/Clinic  PCP:  Abigail Miyamoto, MD   Evaluation Performed:  New Patient Evaluation  Chief Complaint:  Sore throat History of Present Illness:    Carlos Smith is a 29 y.o. male with sore throat, tender nodes, sweats  The patient does not have symptoms concerning for COVID-19 infection (fever, chills, cough, or new shortness of breath).    Past Medical History:  Diagnosis Date  . Gastroesophageal reflux disease   . Hypertension   . Hypothyroidism     No past surgical history on file.  Family History  Problem Relation Age of Onset  . Hypertension Mother   . Hypertension Father   . Migraines Father   . Cerebrovascular Accident Father   . Stroke Father   . Hypertension Sister   . Hypertension Brother     Social History   Socioeconomic History  . Marital status: Married    Spouse name: Not on file  . Number of children: Not on file  . Years of education: Not on file  . Highest education level: Not on file  Occupational History  . Not on file  Tobacco Use  . Smoking status: Never  Smoker  . Smokeless tobacco: Former Neurosurgeon    Types: Chew  Substance and Sexual Activity  . Alcohol use: Yes    Alcohol/week: 4.0 standard drinks    Types: 4 Cans of beer per week    Comment: twice a week  . Drug use: No  . Sexual activity: Yes    Partners: Female  Other Topics Concern  . Not on file  Social History Narrative  . Not on file   Social Determinants of Health   Financial Resource Strain:   . Difficulty of Paying Living Expenses:   Food Insecurity:   . Worried About Programme researcher, broadcasting/film/video in the Last Year:   . Barista in the Last Year:   Transportation Needs:   . Freight forwarder (Medical):   [name] Lack of Transportation (Non-Medical):   Physical Activity:   . Days of Exercise per Week:   . Minutes of Exercise per Session:   Stress:   . Feeling of Stress :   Social Connections:   . Frequency of Communication with Friends and Family:   . Frequency of Social Gatherings with Friends and Family:   . Attends Religious Services:   . Active Member of Clubs or Organizations:   . Attends Banker Meetings:   [name] Marital Status:   Intimate Partner Violence:   . Fear of Current or Ex-Partner:   . Emotionally Abused:   [name] Physically Abused:   .  Sexually Abused:     Outpatient Medications Prior to Visit  Medication Sig Dispense Refill  . levothyroxine (SYNTHROID) 25 MCG tablet Take 1 tablet (25 mcg total) by mouth daily. 90 tablet 2  . lisinopril (ZESTRIL) 10 MG tablet Take 10 mg by mouth daily.    . montelukast (SINGULAIR) 10 MG tablet Take 10 mg by mouth daily.     No facility-administered medications prior to visit.   .med Allergies:   Patient has no known allergies.   Social History   Tobacco Use  . Smoking status: Never Smoker  . Smokeless tobacco: Former Neurosurgeon    Types: Chew  Substance Use Topics  . Alcohol use: Yes    Alcohol/week: 4.0 standard drinks    Types: 4 Cans of beer per week    Comment: twice a week  . Drug use: No      Review of Systems  Constitutional: Positive for chills and malaise/fatigue.  HENT: Positive for sinus pain and sore throat.   Eyes: Negative.   Respiratory: Negative.   Cardiovascular: Negative.   Gastrointestinal: Negative.   Genitourinary: Negative.   Musculoskeletal: Negative.   Neurological: Negative.      Labs/Other Tests and Data Reviewed:    Recent Labs: No results found for requested labs within last 8760 hours.   Recent Lipid Panel Lab Results  Component Value Date/Time   CHOL 163 06/18/2017 09:53 AM   TRIG 140 06/18/2017 09:53 AM   HDL 38 (L) 06/18/2017 09:53 AM   CHOLHDL 4.3 06/18/2017 09:53 AM   LDLCALC 97 06/18/2017 09:53 AM    Wt Readings from Last 3 Encounters:  10/25/19 185 lb 6.4 oz (84.1 kg)  06/18/17 195 lb (88.5 kg)  01/02/14 170 lb (77.1 kg)     Objective:    Vital Signs:  There were no vitals taken for this visit.   Physical Exam VS reviewed  ASSESSMENT & PLAN:   There are no diagnoses linked to this encounter.  Diagnoses and all orders for this visit: Sore throat -     amoxicillin (AMOXIL) 875 MG tablet; Take 1 tablet (875 mg total) by mouth 2 (two) times daily.  Patient is having sore throat with adenopathy, treat with amoxicillin    COVID-19 Education: The signs and symptoms of COVID-19 were discussed with the patient and how to seek care for testing (follow up with PCP or arrange E-visit). The importance of social distancing was discussed today.  Time:   Today, I have spent 20 minutes with the patient with telehealth technology discussing the above problems.    Follow Up:  In Person prn  Signed, Brent Bulla, MD  11/09/2019 2:07 PM    [facility]

## 2019-11-16 ENCOUNTER — Other Ambulatory Visit: Payer: Self-pay

## 2019-11-16 MED ORDER — LISINOPRIL 10 MG PO TABS
10.0000 mg | ORAL_TABLET | Freq: Every day | ORAL | 2 refills | Status: DC
Start: 2019-11-16 — End: 2020-08-29

## 2019-12-29 ENCOUNTER — Encounter: Payer: Self-pay | Admitting: Legal Medicine

## 2019-12-29 ENCOUNTER — Telehealth (INDEPENDENT_AMBULATORY_CARE_PROVIDER_SITE_OTHER): Payer: Self-pay | Admitting: Legal Medicine

## 2019-12-29 VITALS — Temp 99.4°F | Ht 70.0 in | Wt 185.0 lb

## 2019-12-29 DIAGNOSIS — J028 Acute pharyngitis due to other specified organisms: Secondary | ICD-10-CM

## 2019-12-29 MED ORDER — AZITHROMYCIN 250 MG PO TABS: ORAL_TABLET | ORAL | 0 refills | Status: DC

## 2019-12-29 MED ORDER — PREDNISONE 10 MG (21) PO TBPK: ORAL_TABLET | ORAL | 0 refills | Status: DC

## 2019-12-29 NOTE — Progress Notes (Signed)
Virtual Visit via Telephone Note   This visit type was conducted due to national recommendations for restrictions regarding the COVID-19 Pandemic (e.g. social distancing) in an effort to limit this patient's exposure and mitigate transmission in our community.  Due to his co-morbid illnesses, this patient is at least at moderate risk for complications without adequate follow up.  This format is felt to be most appropriate for this patient at this time.  The patient did not have access to video technology/had technical difficulties with video requiring transitioning to audio format only (telephone).  All issues noted in this document were discussed and addressed.  No physical exam could be performed with this format.  Patient verbally consented to a telehealth visit.   Date:  12/29/2019   ID:  Carlos Smith, DOB 11-Jul-1990, MRN [id number]  Patient Location: Home Provider Location: Office/Clinic  PCP:  Abigail Miyamoto, MD   Evaluation Performed:  New Patient Evaluation  Chief Complaint:  Sick Monday 9/13 sore throat  History of Present Illness:    Carlos Smith is a 29 y.o. male with sick 9.13, sinus congestion, no fever, or chills, throat sore but no nodes, child had same earlier this week with negative Covid.  The patient does not have symptoms concerning for COVID-19 infection (fever, chills, cough, or new shortness of breath).    Past Medical History:  Diagnosis Date  . Gastroesophageal reflux disease   . Hypertension   . Hypothyroidism     Past Surgical History:  Procedure Laterality Date  . NASAL SEPTUM SURGERY  05/2018    Family History  Problem Relation Age of Onset  . Hypertension Mother   . Hypertension Father   . Migraines Father   . Cerebrovascular Accident Father   . Stroke Father   . Hypertension Sister   . Hypertension Brother     Social History   Socioeconomic History  . Marital status: Married    Spouse name: Not on file  . Number of  children: Not on file  . Years of education: Not on file  . Highest education level: Not on file  Occupational History  . Not on file  Tobacco Use  . Smoking status: Never Smoker  . Smokeless tobacco: Former Neurosurgeon    Types: Chew  Substance and Sexual Activity  . Alcohol use: Yes    Alcohol/week: 4.0 standard drinks    Types: 4 Cans of beer per week    Comment: twice a week  . Drug use: No  . Sexual activity: Yes    Partners: Female  Other Topics Concern  . Not on file  Social History Narrative  . Not on file   Social Determinants of Health   Financial Resource Strain:   . Difficulty of Paying Living Expenses: Not on file  Food Insecurity:   . Worried About Programme researcher, broadcasting/film/video in the Last Year: Not on file  . Ran Out of Food in the Last Year: Not on file  Transportation Needs:   . Lack of Transportation (Medical): Not on file  . Lack of Transportation (Non-Medical): Not on file  Physical Activity:   . Days of Exercise per Week: Not on file  . Minutes of Exercise per Session: Not on file  Stress:   . Feeling of Stress : Not on file  Social Connections:   . Frequency of Communication with Friends and Family: Not on file  . Frequency of Social Gatherings with Friends and Family: Not on file  .  Attends Religious Services: Not on file  . Active Member of Clubs or Organizations: Not on file  . Attends Banker Meetings: Not on file  . Marital Status: Not on file  Intimate Partner Violence:   . Fear of Current or Ex-Partner: Not on file  . Emotionally Abused: Not on file  . Physically Abused: Not on file  . Sexually Abused: Not on file    Outpatient Medications Prior to Visit  Medication Sig Dispense Refill  . levothyroxine (SYNTHROID) 25 MCG tablet Take 1 tablet (25 mcg total) by mouth daily. 90 tablet 2  . lisinopril (ZESTRIL) 10 MG tablet Take 1 tablet (10 mg total) by mouth daily. 90 tablet 2  . montelukast (SINGULAIR) 10 MG tablet Take 10 mg by mouth  daily.    [name] amoxicillin (AMOXIL) 875 MG tablet Take 1 tablet (875 mg total) by mouth 2 (two) times daily. 20 tablet 0   No facility-administered medications prior to visit.   .med Allergies:   Patient has no known allergies.   Social History   Tobacco Use  . Smoking status: Never Smoker  . Smokeless tobacco: Former Neurosurgeon    Types: Chew  Substance Use Topics  . Alcohol use: Yes    Alcohol/week: 4.0 standard drinks    Types: 4 Cans of beer per week    Comment: twice a week  . Drug use: No     Review of Systems  Constitutional: Negative for chills and fever.  HENT: Positive for congestion, sinus pain and sore throat.   Eyes: Negative.   Respiratory: Negative for cough and hemoptysis.   Cardiovascular: Negative for palpitations, orthopnea, claudication and leg swelling.  Gastrointestinal: Negative.   Genitourinary: Negative.   Musculoskeletal: Negative.   Neurological: Negative.   Psychiatric/Behavioral: Negative.      Labs/Other Tests and Data Reviewed:    Recent Labs: No results found for requested labs within last 8760 hours.   Recent Lipid Panel Lab Results  Component Value Date/Time   CHOL 163 06/18/2017 09:53 AM   TRIG 140 06/18/2017 09:53 AM   HDL 38 (L) 06/18/2017 09:53 AM   CHOLHDL 4.3 06/18/2017 09:53 AM   LDLCALC 97 06/18/2017 09:53 AM    Wt Readings from Last 3 Encounters:  12/29/19 185 lb (83.9 kg)  10/25/19 185 lb 6.4 oz (84.1 kg)  06/18/17 195 lb (88.5 kg)     Objective:    Vital Signs:  Temp 99.4 F (37.4 C) (Oral)   Ht 5\' 10"  (1.778 m)   Wt 185 lb (83.9 kg)   BMI 26.54 kg/m    Physical Exam vs reviewed  ASSESSMENT & PLAN:   Diagnoses and all orders for this visit: Pharyngitis due to other organism -     azithromycin (ZITHROMAX) 250 MG tablet; 2 tablets on day 1, then 1 tablet daily on days 2-6. -     predniSONE (STERAPRED UNI-PAK 21 TAB) 10 MG (21) TBPK tablet; Take 6ills first day , then 5 pills day 2 and then cut down one pill day  until gone Little risk for covid, the appears to be seasonal allergies with secondary sinusitis       COVID-19 Education: The signs and symptoms of COVID-19 were discussed with the patient and how to seek care for testing (follow up with PCP or arrange E-visit). The importance of social distancing was discussed today.  Time:   Today, I have spent 20 minutes with the patient with telehealth technology discussing the above problems.  Follow Up:  In Person prn  Signed, Brent Bulla, MD  12/29/2019 9:40 AM    [facility]

## 2020-03-27 ENCOUNTER — Encounter: Payer: Self-pay | Admitting: Legal Medicine

## 2020-03-27 ENCOUNTER — Telehealth (INDEPENDENT_AMBULATORY_CARE_PROVIDER_SITE_OTHER): Payer: 59 | Admitting: Legal Medicine

## 2020-03-27 VITALS — Ht 70.0 in | Wt 185.0 lb

## 2020-03-27 DIAGNOSIS — J019 Acute sinusitis, unspecified: Secondary | ICD-10-CM

## 2020-03-27 MED ORDER — AMOXICILLIN-POT CLAVULANATE 875-125 MG PO TABS: 1.0000 | ORAL_TABLET | Freq: Two times a day (BID) | ORAL | 0 refills | Status: DC

## 2020-03-27 MED ORDER — PREDNISONE 10 MG (21) PO TBPK: ORAL_TABLET | ORAL | 0 refills | Status: DC

## 2020-03-27 NOTE — Progress Notes (Signed)
Virtual Visit via Telephone Note   This visit type was conducted due to national recommendations for restrictions regarding the COVID-19 Pandemic (e.g. social distancing) in an effort to limit this patient's exposure and mitigate transmission in our community.  Due to his co-morbid illnesses, this patient is at least at moderate risk for complications without adequate follow up.  This format is felt to be most appropriate for this patient at this time.  The patient did not have access to video technology/had technical difficulties with video requiring transitioning to audio format only (telephone).  All issues noted in this document were discussed and addressed.  No physical exam could be performed with this format.  Patient verbally consented to a telehealth visit.   Date:  03/27/2020   ID:  Carlos Smith, DOB 1991/03/06, MRN [id number]  Patient Location: Home Provider Location: Office/Clinic  PCP:  Abigail Miyamoto, MD   Evaluation Performed:  New Patient Evaluation  Chief Complaint:  Sinus congestion, aching, sore throat  History of Present Illness:    Carlos Smith is a 29 y.o. male with Sinus congestion, aching, sore throat. Not around covid. no fever or chills  The patient does not have symptoms concerning for COVID-19 infection (fever, chills, cough, or new shortness of breath).    Past Medical History:  Diagnosis Date  . Gastroesophageal reflux disease   . Hypertension   . Hypothyroidism     Past Surgical History:  Procedure Laterality Date  . NASAL SEPTUM SURGERY  05/2018    Family History  Problem Relation Age of Onset  . Hypertension Mother   . Hypertension Father   . Migraines Father   . Cerebrovascular Accident Father   . Stroke Father   . Hypertension Sister   . Hypertension Brother     Social History   Socioeconomic History  . Marital status: Married    Spouse name: Not on file  . Number of children: Not on file  . Years of education: Not on  file  . Highest education level: Not on file  Occupational History  . Not on file  Tobacco Use  . Smoking status: Never Smoker  . Smokeless tobacco: Current User    Types: Chew  Substance and Sexual Activity  . Alcohol use: Yes    Alcohol/week: 4.0 standard drinks    Types: 4 Cans of beer per week    Comment: twice a week  . Drug use: No  . Sexual activity: Yes    Partners: Female  Other Topics Concern  . Not on file  Social History Narrative  . Not on file   Social Determinants of Health   Financial Resource Strain: Not on file  Food Insecurity: Not on file  Transportation Needs: Not on file  Physical Activity: Not on file  Stress: Not on file  Social Connections: Not on file  Intimate Partner Violence: Not on file    Outpatient Medications Prior to Visit  Medication Sig Dispense Refill  . levothyroxine (SYNTHROID) 25 MCG tablet Take 1 tablet (25 mcg total) by mouth daily. 90 tablet 2  . lisinopril (ZESTRIL) 10 MG tablet Take 1 tablet (10 mg total) by mouth daily. 90 tablet 2  . montelukast (SINGULAIR) 10 MG tablet Take 10 mg by mouth daily.    [name] azithromycin (ZITHROMAX) 250 MG tablet 2 tablets on day 1, then 1 tablet daily on days 2-6. 6 tablet 0  . predniSONE (STERAPRED UNI-PAK 21 TAB) 10 MG (21) TBPK tablet Take 6ills first  day , then 5 pills day 2 and then cut down one pill day until gone 21 tablet 0   No facility-administered medications prior to visit.    Allergies:   Patient has no known allergies.   Social History   Tobacco Use  . Smoking status: Never Smoker  . Smokeless tobacco: Current User    Types: Chew  Substance Use Topics  . Alcohol use: Yes    Alcohol/week: 4.0 standard drinks    Types: 4 Cans of beer per week    Comment: twice a week  . Drug use: No     Review of Systems  Constitutional: Negative for chills and fever.  HENT: Positive for congestion and sinus pain. Negative for ear pain.   Eyes: Negative for redness.  Respiratory:  Positive for cough.   Cardiovascular: Negative for chest pain and palpitations.  Musculoskeletal: Positive for myalgias. Negative for neck pain.  Skin: Negative for itching and rash.  Neurological: Negative for headaches.     Labs/Other Tests and Data Reviewed:    Recent Labs: No results found for requested labs within last 8760 hours.   Recent Lipid Panel Lab Results  Component Value Date/Time   CHOL 163 06/18/2017 09:53 AM   TRIG 140 06/18/2017 09:53 AM   HDL 38 (L) 06/18/2017 09:53 AM   CHOLHDL 4.3 06/18/2017 09:53 AM   LDLCALC 97 06/18/2017 09:53 AM    Wt Readings from Last 3 Encounters:  03/27/20 185 lb (83.9 kg)  12/29/19 185 lb (83.9 kg)  10/25/19 185 lb 6.4 oz (84.1 kg)     Objective:    Vital Signs:  Ht 5\' 10"  (1.778 m)   Wt 185 lb (83.9 kg)   BMI 26.54 kg/m    Physical Exam   ASSESSMENT & PLAN:   Diagnoses and all orders for this visit: Acute non-recurrent sinusitis, unspecified location -     amoxicillin-clavulanate (AUGMENTIN) 875-125 MG tablet; Take 1 tablet by mouth 2 (two) times daily. -     predniSONE (STERAPRED UNI-PAK 21 TAB) 10 MG (21) TBPK tablet; Take 6ills first day , then 5 pills day 2 and then cut down one pill day until gone Patient has sinus congestion and pain, we will treat with antibiotic and prednisone pack       COVID-19 Education: The signs and symptoms of COVID-19 were discussed with the patient and how to seek care for testing (follow up with PCP or arrange E-visit). The importance of social distancing was discussed today.   I spent 15 minutes dedicated to the care of this patient on the date of this encounter to include face-to-face time with the patient, as well as:   Follow Up:  In Person prn  Signed,  , MD  03/27/2020 1:32 PM    [facility]

## 2020-03-29 ENCOUNTER — Telehealth: Payer: Self-pay

## 2020-03-29 NOTE — Telephone Encounter (Signed)
Patient has been taking augmentin and he has not stopped to use the bathroom. He has a bad diarrhea. He feels weak. I recommended him to stop the medication and drink a lot of fluid. Please advice.

## 2020-03-29 NOTE — Telephone Encounter (Signed)
Stop medicine lp

## 2020-05-09 ENCOUNTER — Ambulatory Visit: Payer: 59 | Admitting: Legal Medicine

## 2020-05-09 ENCOUNTER — Encounter: Payer: Self-pay | Admitting: Legal Medicine

## 2020-05-09 ENCOUNTER — Other Ambulatory Visit: Payer: Self-pay

## 2020-05-09 VITALS — BP 126/88 | HR 95 | Temp 98.1°F | Resp 16 | Ht 70.0 in | Wt 181.4 lb

## 2020-05-09 DIAGNOSIS — K529 Noninfective gastroenteritis and colitis, unspecified: Secondary | ICD-10-CM | POA: Diagnosis not present

## 2020-05-09 NOTE — Progress Notes (Signed)
Subjective:  Patient ID: Carlos Smith, male    DOB: 10-28-1990  Age: 30 y.o. MRN: [id number]  Chief Complaint  Patient presents with  . Bloated    Patient states his stools have been watery for about 2 months now, he is feeling fatigued and bloated. His abdomin is tender to touch. States he sometimes vomits after meals. He has been using OTC anti-diarrhea and Nexium medicines but does not help.    HPI: patient has well water and had abdominal pain and bloating after meals.  Family has celiac disease.  Stool is watery.  No gluten free diet. No melena.lost 4 lbs.uses almond milk.  No dairy products.  With the diarrhea he is not trying to change his diet he needs this gets gravy a lot of bread and fast foods.  He has not tried a low gluten diet.  He has no other family history of inflammatory bowel disease.  He has lost about 4 pounds.   Current Outpatient Medications on File Prior to Visit  Medication Sig Dispense Refill  . levothyroxine (SYNTHROID) 25 MCG tablet Take 1 tablet (25 mcg total) by mouth daily. 90 tablet 2  . lisinopril (ZESTRIL) 10 MG tablet Take 1 tablet (10 mg total) by mouth daily. 90 tablet 2  . montelukast (SINGULAIR) 10 MG tablet Take 10 mg by mouth daily.     No current facility-administered medications on file prior to visit.   Past Medical History:  Diagnosis Date  . Gastroesophageal reflux disease   . Hypertension   . Hypothyroidism    Past Surgical History:  Procedure Laterality Date  . NASAL SEPTUM SURGERY  05/2018    Family History  Problem Relation Age of Onset  . Hypertension Mother   . Hypertension Father   . Migraines Father   . Cerebrovascular Accident Father   . Stroke Father   . Hypertension Sister   . Hypertension Brother    Social History   Socioeconomic History  . Marital status: Married    Spouse name: Not on file  . Number of children: Not on file  . Years of education: Not on file  . Highest education level: Not on file   Occupational History  . Not on file  Tobacco Use  . Smoking status: Never Smoker  . Smokeless tobacco: Former Neurosurgeon    Types: Chew  Substance and Sexual Activity  . Alcohol use: Yes    Alcohol/week: 4.0 standard drinks    Types: 4 Cans of beer per week    Comment: twice a week  . Drug use: No  . Sexual activity: Yes    Partners: Female  Other Topics Concern  . Not on file  Social History Narrative  . Not on file   Social Determinants of Health   Financial Resource Strain: Not on file  Food Insecurity: Not on file  Transportation Needs: Not on file  Physical Activity: Not on file  Stress: Not on file  Social Connections: Not on file    Review of Systems  Constitutional: Negative for activity change and appetite change.  HENT: Negative for congestion and sinus pain.   Eyes: Negative for visual disturbance.  Respiratory: Negative for apnea, chest tightness and shortness of breath.   Cardiovascular: Negative for chest pain, palpitations and leg swelling.  Gastrointestinal: Positive for abdominal pain and diarrhea.  Endocrine: Negative for polyuria.  Genitourinary: Negative for difficulty urinating, dysuria and urgency.  Musculoskeletal: Negative for arthralgias and back pain.  Skin: Negative.  Neurological: Negative.   Psychiatric/Behavioral: Negative.      Objective:  BP 126/88 (BP Location: Left Arm, Patient Position: Sitting, Cuff Size: Normal)   Pulse 95   Temp 98.1 F (36.7 C) (Temporal)   Resp 16   Ht 5\' 10"  (1.778 m)   Wt 181 lb 6.4 oz (82.3 kg)   SpO2 98%   BMI 26.03 kg/m   BP/Weight 05/09/2020 03/27/2020 12/29/2019  Systolic BP 126 - -  Diastolic BP 88 - -  Wt. (Lbs) 181.4 185 185  BMI 26.03 26.54 26.54    Physical Exam Vitals reviewed.  Constitutional:      Appearance: Normal appearance.  HENT:     Right Ear: Tympanic membrane normal.     Left Ear: Tympanic membrane normal.     Nose: Nose normal.     Mouth/Throat:     Mouth: Mucous  membranes are moist.  Eyes:     Extraocular Movements: Extraocular movements intact.     Conjunctiva/sclera: Conjunctivae normal.     Pupils: Pupils are equal, round, and reactive to light.  Cardiovascular:     Rate and Rhythm: Normal rate and regular rhythm.     Pulses: Normal pulses.     Heart sounds: Normal heart sounds. No murmur heard. No gallop.   Pulmonary:     Effort: Pulmonary effort is normal. No respiratory distress.     Breath sounds: Normal breath sounds. No wheezing.  Abdominal:     General: Abdomen is flat. Bowel sounds are normal.     Palpations: Abdomen is soft.     Tenderness: There is abdominal tenderness (epigastric).  Musculoskeletal:        General: Normal range of motion.     Cervical back: Normal range of motion and neck supple.  Skin:    General: Skin is warm and dry.     Capillary Refill: Capillary refill takes less than 2 seconds.  Neurological:     General: No focal deficit present.     Mental Status: He is alert. Mental status is at baseline.  Psychiatric:        Mood and Affect: Mood normal.        Thought Content: Thought content normal.       Lab Results  Component Value Date   WBC 8.1 06/18/2017   HGB 15.6 06/18/2017   HCT 46.1 06/18/2017   PLT 245 06/18/2017   GLUCOSE 83 06/18/2017   CHOL 163 06/18/2017   TRIG 140 06/18/2017   HDL 38 (L) 06/18/2017   LDLCALC 97 06/18/2017   ALT 27 06/18/2017   AST 17 06/18/2017   NA 141 06/18/2017   K 4.7 06/18/2017   CL 100 06/18/2017   CREATININE 0.63 (L) 06/18/2017   BUN 8 06/18/2017   CO2 27 06/18/2017   TSH 1.560 06/18/2017      Assessment & Plan:    Diagnoses and all orders for this visit: Chronic diarrhea -     CBC with Differential/Platelet -     Comprehensive metabolic panel -     Stool culture -     C-reactive protein -     Celiac Disease Comprehensive Panel w Reflexes, Infant Patient has been having chronic watery diarrhea with bloating and some cramping for approximately  2 months.  He has lost 4 pounds.  He needs a work-up for possible infection inflammatory bowel disease and celiac disease which runs in his family.  I will see him back in 2 weeks if there is  any problems we will get gastroenterology consult.        I spent 20 minutes dedicated to the care of this patient on the date of this encounter to include face-to-face time with the patient, as well as:   Follow-up: Return in about 2 weeks (around 05/23/2020) for fu diarrhea.  An After Visit Summary was printed and given to the patient.  Brent Bulla, MD [facility] [phone]

## 2020-05-09 NOTE — Patient Instructions (Signed)
Gluten-Free Diet for Celiac Disease, Adult  The gluten-free diet includes all foods that do not contain gluten. Gluten is a protein that is found in wheat, rye, barley, and some other grains. Following the gluten-free diet is the only treatment for people with celiac disease. It helps to prevent damage to the intestines and improves or eliminates the symptoms of celiac disease. Following the gluten-free diet requires some planning. It can be challenging at first, but it gets easier with time and practice. There are more gluten-free options available today than ever before. If you need help finding gluten-free foods or if you have questions, talk with your dietitian or your health care provider. What are tips for following this plan? Reading food labels Read all food labels. Gluten is often added to foods. Always check the ingredient list and look for warnings, such as "may contain gluten." Foods that list any of these key words on the label usually contain gluten:  Wheat, flour, enriched flour, bromated flour, white flour, durum flour, graham flour, phosphated flour, self-rising flour, semolina, farina, barley (malt), rye, and oats.  Starch, dextrin, modified food starch, or cereal.  Thickening, fillers, or emulsifiers.  Malt flavoring, malt extract, or malt syrup.  Hydrolyzed vegetable protein. In the [place] packaged foods that are gluten-free are required to be labeled "GF." These foods should be easy to identify and are safe to eat. In the [place] food companies are also required to list common food allergens, including wheat, on their labels. Shopping When grocery shopping, start by shopping in the produce, meat, and dairy sections. These sections are more likely to contain gluten-free foods. Then move to the aisles that contain packaged foods if you need to. Meal planning  All fruits, vegetables, and meats are safe to eat and do not contain gluten.  Talk with your dietitian or health care  provider before taking a gluten-free multivitamin or mineral supplement.  Be aware of gluten-free foods having contact with foods that contain gluten (cross-contamination). This can happen at home and with any processed foods. ? Talk with your health care provider or dietitian about how to reduce the risk of cross-contamination in your home. ? If you have questions about how a food is processed, ask the manufacturer. What foods can I eat? Fruits All plain fresh, frozen, canned, and dried fruits, and 100% fruit juices. Vegetables All plain fresh, frozen, and canned vegetables. Grains Amaranth, bean flours, 100% buckwheat flour, corn, millet, nut flours or nut meals, GF oats, quinoa, rice, sorghum, teff, rice wafers, pure cornmeal tortillas, popcorn, and hot cereals made from cornmeal.  Hominy, rice, wild rice. Some Asian rice noodles or bean noodles.  Arrowroot starch, corn bran, corn flour, corn germ, cornmeal, corn starch, potato flour, potato starch flour, and rice bran. Plain, brown, and sweet rice flours. Rice polish, soy flour, and tapioca starch. Meats and other protein foods All fresh beef, pork, poultry, fish, seafood, and eggs. Fish canned in water, oil, brine, or vegetable broth. Plain nuts and seeds, peanut butter.  Some precooked or cured meat, such as sausages or meat loaves. Some frankfurters. Dried beans, dried peas, and lentils. Dairy Fresh plain, dry, evaporated, or condensed milk. Cream, butter, sour cream, whipping cream, and most yogurts. Unprocessed cheese, most processed cheeses, some cottage cheeses, some cream cheeses. Beverages Coffee, tea, most herbal teas. Carbonated beverages and some root beers. Wine, sake, and distilled spirits, such as gin, vodka, and whiskey. Most hard ciders. Fats and oils Butter, margarine, vegetable oil, hydrogenated butter,   olive oil, shortening, lard, cream, and some mayonnaise. Some commercial salad dressings. Olives. Sweets and  desserts Sugar, honey, some syrups, molasses, jelly, and jam. Plain hard candy, marshmallows, and gumdrops.  Pure cocoa powder. Plain chocolate. Custard and some pudding mixes. Gelatin desserts, sorbets, frozen ice pops, and sherbet.  Cake, cookies, and other desserts prepared with allowed flours. Some commercial ice creams. Cornstarch, tapioca, and rice puddings. Seasoning and other foods Some canned or frozen soups. Monosodium glutamate (MSG). Cider, rice, and wine vinegar. Baking soda and baking powder.  Cream of tartar. Baking and nutritional yeast. Certain soy sauces made without wheat (ask your dietitian about specific brands that are allowed).  Nuts, coconut, and chocolate. Salt, pepper, herbs, spices, flavoring extracts, imitation or artificial flavorings, natural flavorings, and food colorings.  Some medicines and supplements. Rice syrups. The items listed above may not be a complete list of foods and beverages you can eat. Contact a dietitian for more information. What foods should I avoid? Fruits Thickened or prepared fruits and some pie fillings. Some fruit snacks and fruit roll-ups. Vegetables Most creamed vegetables and most vegetables canned in sauces. Some commercially prepared vegetables and salads. Vegetables in a soy sauce marinade or dressing. Grains Barley, bran, bulgur, couscous, cracked wheat, [place], farro, graham, malt, matzo, semolina, wheat germ, and all wheat and rye cereals including spelt and kamut.  Cereals containing malt as a flavoring, such as rice cereal. Noodles, spaghetti, macaroni, most packaged rice mixes, and all mixes containing wheat, rye, barley, or triticale. Meats and other protein foods Any meat or meat alternative containing wheat, rye, barley, or gluten stabilizers. These are often marinated or packaged meats, and precooked or cured meat, such as sausages or meat loaves. Bread-containing products, such as Swiss steak, croquettes, meatballs, and  meatloaf. Most tuna canned in vegetable broth and turkey with hydrolyzed vegetable protein (HVP) injected as part of the basting. Seitan. Imitation fish. Eggs in sauces made from ingredients to avoid. Dairy Commercial chocolate milk drinks and malted milk. Some non-dairy creamers. Any cheese product containing ingredients to avoid. Beverages Certain cereal beverages. Beer, ale, malted milk, and some root beers. Some hard ciders. Some instant flavored coffees. Some herbal teas made with barley or with barley malt added. Fats and oils Some commercial salad dressings. Sour cream containing modified food starch. Sweets and desserts Some toffees. Chocolate-coated nuts (may be rolled in wheat flour) and some commercial candies and candy bars.  Most cakes, cookies, donuts, pastries, and other baked goods. Some commercial ice cream. Ice cream cones.  Commercially prepared mixes for cakes, cookies, and other desserts. Bread pudding and other puddings thickened with flour.  Products containing brown rice syrup made with barley malt enzyme. Desserts and sweets made with malt flavoring. Seasoning and other foods Some curry powders, some dry seasoning mixes, some gravy extracts, some meat sauces, some ketchups, some prepared mustards, and horseradish.  Certain soy sauces. Malt vinegar. Bouillon and bouillon cubes that contain HVP. Some chip dips, and some chewing gum.  Yeast extract. Brewer's yeast. Caramel color.  Some medicines and supplements. The items listed above may not be a complete list of foods and beverages you should avoid. Contact a dietitian for more information. Summary  Gluten is a protein that is found in wheat, rye, barley, and some other grains. The gluten-free diet includes all foods that do not contain gluten.  If you need help finding gluten-free foods or if you have questions, talk with your dietitian or your health care provider.    Read all food labels. Gluten is often added to  foods. Always check the ingredient list and look for warnings, such as "may contain gluten." This information is not intended to replace advice given to you by your health care provider. Make sure you discuss any questions you have with your health care provider. Document Revised: 04/05/2019 Document Reviewed: 04/05/2019 Elsevier Patient Education  2021 Elsevier Inc.  

## 2020-05-10 LAB — COMPREHENSIVE METABOLIC PANEL
ALT: 28 IU/L (ref 0–44)
AST: 19 IU/L (ref 0–40)
Albumin/Globulin Ratio: 2.2 (ref 1.2–2.2)
Albumin: 5.1 g/dL (ref 4.1–5.2)
Alkaline Phosphatase: 99 IU/L (ref 44–121)
BUN/Creatinine Ratio: 8 — ABNORMAL LOW (ref 9–20)
BUN: 7 mg/dL (ref 6–20)
Bilirubin Total: 0.4 mg/dL (ref 0.0–1.2)
CO2: 23 mmol/L (ref 20–29)
Calcium: 9.8 mg/dL (ref 8.7–10.2)
Chloride: 98 mmol/L (ref 96–106)
Creatinine, Ser: 0.86 mg/dL (ref 0.76–1.27)
GFR calc Af Amer: 135 mL/min/{1.73_m2} (ref >59)
GFR calc non Af Amer: 117 mL/min/{1.73_m2} (ref >59)
Globulin, Total: 2.3 g/dL (ref 1.5–4.5)
Glucose: 88 mg/dL (ref 65–99)
Potassium: 4.5 mmol/L (ref 3.5–5.2)
Sodium: 138 mmol/L (ref 134–144)
Total Protein: 7.4 g/dL (ref 6.0–8.5)

## 2020-05-10 LAB — CBC WITH DIFFERENTIAL/PLATELET
Basophils Absolute: 0 10*3/uL (ref 0.0–0.2)
Basos: 0 %
EOS (ABSOLUTE): 0.1 10*3/uL (ref 0.0–0.4)
Eos: 1 %
Hematocrit: 45 % (ref 37.5–51.0)
Hemoglobin: 16.1 g/dL (ref 13.0–17.7)
Immature Grans (Abs): 0 10*3/uL (ref 0.0–0.1)
Immature Granulocytes: 0 %
Lymphocytes Absolute: 1.9 10*3/uL (ref 0.7–3.1)
Lymphs: 20 %
MCH: 31.1 pg (ref 26.6–33.0)
MCHC: 35.8 g/dL — ABNORMAL HIGH (ref 31.5–35.7)
MCV: 87 fL (ref 79–97)
Monocytes Absolute: 0.8 10*3/uL (ref 0.1–0.9)
Monocytes: 8 %
Neutrophils Absolute: 6.4 10*3/uL (ref 1.4–7.0)
Neutrophils: 71 %
Platelets: 336 10*3/uL (ref 150–450)
RBC: 5.17 x10E6/uL (ref 4.14–5.80)
RDW: 11.9 % (ref 11.6–15.4)
WBC: 9.1 10*3/uL (ref 3.4–10.8)

## 2020-05-10 LAB — CELIAC DISEASE COMPREHENSIVE PANEL W REFLEXES, INFANT
Antigliadin Abs, IgA: 254 units — ABNORMAL HIGH (ref 0–19)
IgA/Immunoglobulin A, Serum: 186 mg/dL (ref 90–386)
Transglutaminase IgA: >100 U/mL — ABNORMAL HIGH (ref 0–3)

## 2020-05-10 LAB — C-REACTIVE PROTEIN: CRP: <1 mg/L (ref 0–10)

## 2020-05-10 NOTE — Progress Notes (Signed)
CBC normal, kidney and liver tests normal, CRP <1- no inflammation, transglutamase is high, antigliadin Ab high- strongly suggests celiac disease.  We need to get you on gluten free diet. lp

## 2020-05-11 NOTE — Progress Notes (Signed)
Mchc is just a calculated value, not significant, Low BUN/cr level means he is well hydrated lp

## 2020-05-14 LAB — STOOL CULTURE: E coli, Shiga toxin Assay: NEGATIVE

## 2020-05-14 NOTE — Progress Notes (Signed)
Negative culture for stool infections lp

## 2020-05-15 ENCOUNTER — Other Ambulatory Visit: Payer: Self-pay

## 2020-05-15 ENCOUNTER — Ambulatory Visit: Payer: 59 | Admitting: Legal Medicine

## 2020-05-15 ENCOUNTER — Encounter: Payer: Self-pay | Admitting: Legal Medicine

## 2020-05-15 DIAGNOSIS — K900 Celiac disease: Secondary | ICD-10-CM

## 2020-05-15 DIAGNOSIS — L52 Erythema nodosum: Secondary | ICD-10-CM

## 2020-05-15 MED ORDER — TRIAMCINOLONE ACETONIDE 40 MG/ML IJ SUSP
80.0000 mg | Freq: Once | INTRAMUSCULAR | Status: AC
  Administered 2020-05-15: 80 mg via INTRAMUSCULAR

## 2020-05-15 MED ORDER — TRIAMCINOLONE ACETONIDE 0.1 % EX CREA: 1.0000 "application " | TOPICAL_CREAM | Freq: Two times a day (BID) | CUTANEOUS | 3 refills | Status: DC

## 2020-05-15 NOTE — Progress Notes (Signed)
Subjective:  Patient ID: Carlos Smith, male    DOB: 30-Jul-1990  Age: 30 y.o. MRN: [id number]  Chief Complaint  Patient presents with  . Rash    Since 5 days ago, patient noticed rash on hands, feet, knees and arms. He uses steroid and it did not help. Itching and burning.    HPI: rash  maculpapular rash on dorsum of hands and feet and ankles for one week.  No pain but pruritic.  OTC hydrocortisone.  erythema nodosum rash but not painful.  They do not blanch.  No exposure of scabies.   Current Outpatient Medications on File Prior to Visit  Medication Sig Dispense Refill  . levothyroxine (SYNTHROID) 25 MCG tablet Take 1 tablet (25 mcg total) by mouth daily. 90 tablet 2  . lisinopril (ZESTRIL) 10 MG tablet Take 1 tablet (10 mg total) by mouth daily. 90 tablet 2  . montelukast (SINGULAIR) 10 MG tablet Take 10 mg by mouth daily.     No current facility-administered medications on file prior to visit.   Past Medical History:  Diagnosis Date  . Gastroesophageal reflux disease   . Hypertension   . Hypothyroidism    Past Surgical History:  Procedure Laterality Date  . NASAL SEPTUM SURGERY  05/2018    Family History  Problem Relation Age of Onset  . Hypertension Mother   . Hypertension Father   . Migraines Father   . Cerebrovascular Accident Father   . Stroke Father   . Hypertension Sister   . Hypertension Brother    Social History   Socioeconomic History  . Marital status: Married    Spouse name: Not on file  . Number of children: Not on file  . Years of education: Not on file  . Highest education level: Not on file  Occupational History  . Not on file  Tobacco Use  . Smoking status: Never Smoker  . Smokeless tobacco: Former Neurosurgeon    Types: Chew  Substance and Sexual Activity  . Alcohol use: Yes    Alcohol/week: 4.0 standard drinks    Types: 4 Cans of beer per week    Comment: twice a week  . Drug use: No  . Sexual activity: Yes    Partners: Female  Other Topics  Concern  . Not on file  Social History Narrative  . Not on file   Social Determinants of Health   Financial Resource Strain: Not on file  Food Insecurity: Not on file  Transportation Needs: Not on file  Physical Activity: Not on file  Stress: Not on file  Social Connections: Not on file    Review of Systems  Constitutional: Negative for activity change and unexpected weight change.  HENT: Negative for congestion and sinus pain.   Eyes: Negative for visual disturbance.  Respiratory: Negative for chest tightness.   Cardiovascular: Negative for chest pain and leg swelling.  Gastrointestinal: Negative for abdominal distention and abdominal pain.  Endocrine: Negative for polyuria.  Genitourinary: Negative for difficulty urinating and dysuria.  Musculoskeletal: Negative for arthralgias and back pain.  Skin: Negative.   Neurological: Negative for dizziness and light-headedness.  Psychiatric/Behavioral: Negative for agitation.     Objective:  BP 110/70   Pulse (!) 102   Temp 98 F (36.7 C)   Resp 16   Ht 5\' 10"  (1.778 m)   Wt 176 lb (79.8 kg)   SpO2 98%   BMI 25.25 kg/m   BP/Weight 05/15/2020 05/09/2020 03/27/2020  Systolic BP 110 126 -  Diastolic BP 70 88 -  Wt. (Lbs) 176 181.4 185  BMI 25.25 26.03 26.54    Physical Exam Vitals reviewed.  Constitutional:      Appearance: Normal appearance.  HENT:     Right Ear: Tympanic membrane normal.     Left Ear: Tympanic membrane normal.     Nose: Nose normal.     Mouth/Throat:     Mouth: Mucous membranes are moist.     Pharynx: Oropharynx is clear.  Eyes:     Extraocular Movements: Extraocular movements intact.     Conjunctiva/sclera: Conjunctivae normal.     Pupils: Pupils are equal, round, and reactive to light.  Cardiovascular:     Rate and Rhythm: Normal rate and regular rhythm.     Pulses: Normal pulses.     Heart sounds: Normal heart sounds.  Pulmonary:     Effort: Pulmonary effort is normal.     Breath  sounds: Normal breath sounds.  Abdominal:     General: Abdomen is flat. Bowel sounds are normal. There is no distension.     Palpations: Abdomen is soft.     Tenderness: There is no abdominal tenderness.  Musculoskeletal:        General: Normal range of motion.  Skin:    General: Skin is warm and dry.     Capillary Refill: Capillary refill takes less than 2 seconds.  Neurological:     General: No focal deficit present.     Mental Status: He is alert and oriented to person, place, and time.       Lab Results  Component Value Date   WBC 9.1 05/09/2020   HGB 16.1 05/09/2020   HCT 45.0 05/09/2020   PLT 336 05/09/2020   GLUCOSE 88 05/09/2020   CHOL 163 06/18/2017   TRIG 140 06/18/2017   HDL 38 (L) 06/18/2017   LDLCALC 97 06/18/2017   ALT 28 05/09/2020   AST 19 05/09/2020   NA 138 05/09/2020   K 4.5 05/09/2020   CL 98 05/09/2020   CREATININE 0.86 05/09/2020   BUN 7 05/09/2020   CO2 23 05/09/2020   TSH 1.560 06/18/2017      Assessment & Plan:   Diagnoses and all orders for this visit: Celiac disease He is now on celiac diet and is feeling better  Erythema nodosum -     triamcinolone acetonide (KENALOG-40) injection 80 mg Erythema nodosum type of rash use kenalog 80mg  IM and kenalog cream.  Follow up 2 weeks if not gone         I spent 15 minutes dedicated to the care of this patient on the date of this encounter to include face-to-face time with the patient, as well as:   Follow-up: Return in about 2 weeks (around 05/29/2020) for PRN.  An After Visit Summary was printed and given to the patient.  05/31/2020, MD [facility] [phone]

## 2020-06-19 ENCOUNTER — Other Ambulatory Visit: Payer: Self-pay

## 2020-06-19 MED ORDER — MONTELUKAST SODIUM 10 MG PO TABS: 10.0000 mg | ORAL_TABLET | Freq: Every day | ORAL | 1 refills | Status: DC

## 2020-07-11 ENCOUNTER — Encounter: Payer: Self-pay | Admitting: Sports Medicine

## 2020-07-11 ENCOUNTER — Other Ambulatory Visit: Payer: Self-pay

## 2020-07-11 ENCOUNTER — Ambulatory Visit (INDEPENDENT_AMBULATORY_CARE_PROVIDER_SITE_OTHER): Payer: Self-pay

## 2020-07-11 ENCOUNTER — Other Ambulatory Visit: Payer: Self-pay | Admitting: Sports Medicine

## 2020-07-11 ENCOUNTER — Ambulatory Visit (INDEPENDENT_AMBULATORY_CARE_PROVIDER_SITE_OTHER): Payer: Self-pay | Admitting: Sports Medicine

## 2020-07-11 DIAGNOSIS — M79672 Pain in left foot: Secondary | ICD-10-CM

## 2020-07-11 DIAGNOSIS — M779 Enthesopathy, unspecified: Secondary | ICD-10-CM

## 2020-07-11 DIAGNOSIS — M7662 Achilles tendinitis, left leg: Secondary | ICD-10-CM

## 2020-07-11 DIAGNOSIS — M216X2 Other acquired deformities of left foot: Secondary | ICD-10-CM

## 2020-07-11 DIAGNOSIS — M79676 Pain in unspecified toe(s): Secondary | ICD-10-CM

## 2020-07-11 MED ORDER — DICLOFENAC SODIUM 75 MG PO TBEC: 75.0000 mg | DELAYED_RELEASE_TABLET | Freq: Two times a day (BID) | ORAL | 0 refills | Status: DC

## 2020-07-11 MED ORDER — DICLOFENAC SODIUM 1 % EX GEL
4.0000 g | Freq: Four times a day (QID) | CUTANEOUS | 0 refills | Status: DC
Start: 2020-07-11 — End: 2021-02-27

## 2020-07-11 NOTE — Progress Notes (Signed)
Subjective: Carlos Smith is a 30 y.o. male patient who presents to office for evaluation of Left heel pain. Patient complains of progressive pain especially over the 2 weeks at the left  Achilles area/back of heel.  Reports that he started having pain after working he does a lot of standing and walking and states that pain was so bad on Saturday went to urgent care where he was put in a walking boot which reduces the pain but can still feel a little bit of pressure rubbing at the back of the heel.  Patient also reports that they gave him steroid and tramadol has been taking without complete relief.  Patient denies any injuries.  Patient denies any other pedal complaints.   Patient Active Problem List   Diagnosis Date Noted  . Celiac disease 05/15/2020  . Erythema nodosum 05/15/2020  . Chronic diarrhea 05/09/2020  . Pharyngitis 11/09/2019  . Acute sinusitis 08/31/2019  . Abnormal stress test 06/18/2017  . 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 Gastro-esophageal reflux disease without esophagitis 06/11/2017  . Hypertension 06/11/2017  . Hyperlipidemia 02/08/2014    Current Outpatient Medications on File Prior to Visit  Medication Sig Dispense Refill  . levothyroxine (SYNTHROID) 25 MCG tablet Take 1 tablet (25 mcg total) by mouth daily. 90 tablet 2  . lisinopril (ZESTRIL) 10 MG tablet Take 1 tablet (10 mg total) by mouth daily. 90 tablet 2  . montelukast (SINGULAIR) 10 MG tablet Take 1 tablet (10 mg total) by mouth daily. 90 tablet 1   No current facility-administered medications on file prior to visit.    Allergies  Allergen Reactions  . Augmentin [Amoxicillin-Pot Clavulanate]     Objective:  General: Alert and oriented x3 in no acute distress  Dermatology: Blanchable erythema to the posterior aspect of the left heel.  No open lesions bilateral lower extremities, no webspace macerations, no ecchymosis bilateral, all nails x 10 are well manicured.  Vascular: Dorsalis Pedis and Posterior  Tibial pedal pulses 2/4, Capillary Fill Time 3 seconds, + pedal hair growth bilateral, no edema bilateral lower extremities, Temperature gradient within normal limits.  Neurology: Michaell Cowing sensation intact via light touch bilateral.  Musculoskeletal: Mild to moderate tenderness with palpation at insertion of the Achilles on left, there is mild calcaneal exostosis with mild soft tissue swelling present and decreased ankle rom with knee extending  vs flexed resembling gastroc equnius bilateral, The achilles tendon feels intact with no nodularity or palpable dell, Thompson sign negative, Subtalar and midtarsal joint range of motion is within normal limits, there is no 1st ray hypermobility or forefoot deformity noted bilateral.   Gait: Antalgic gait with increased heel off left  Xrays  Left   Impression: Normal osseous mineralization. Joint spaces preserved. No fracture/dislocation/boney destruction.  No significant calcaneal spur present. Kager's triangle intact with no obliteration. No soft tissue abnormalities or radiopaque foreign bodies.   Assessment and Plan: Problem List Items Addressed This Visit   None   Visit Diagnoses    Achilles tendinitis of left lower extremity    -  Primary   Acquired equinus deformity of left foot       Pain of left heel          -Complete examination performed -Xrays reviewed -Discussed treatment options for likely tendinitis -Advised patient to continue with cam boot -Rx diclofenac to take by mouth as instructed for pain and inflammation -Prescribed diclofenac topical to use as instructed for localized pain and inflammation -Advised gentle stretching and combination of  heat and ice -Recommend patient to remain out of work until after next office visit -Intermittent leave of medical leave paperwork completed on patient's behalf; office to fax -Patient to return to office as scheduled or sooner if condition worsens.  Asencion Islam, DPM

## 2020-07-14 ENCOUNTER — Telehealth: Payer: Self-pay | Admitting: Sports Medicine

## 2020-07-14 NOTE — Telephone Encounter (Signed)
The following patient called inquiring about FMLA paperwork, stated he spoke with his coordinator and they informed him they chant received any information regarding this matter as of 12 pm today, Please Advise  Also I have transferred call over to [facility] for you

## 2020-07-17 ENCOUNTER — Encounter: Payer: Self-pay | Admitting: Legal Medicine

## 2020-07-17 ENCOUNTER — Other Ambulatory Visit: Payer: Self-pay

## 2020-07-17 ENCOUNTER — Ambulatory Visit (INDEPENDENT_AMBULATORY_CARE_PROVIDER_SITE_OTHER): Payer: BC Managed Care – PPO | Admitting: Legal Medicine

## 2020-07-17 DIAGNOSIS — F410 Panic disorder [episodic paroxysmal anxiety] without agoraphobia: Secondary | ICD-10-CM | POA: Diagnosis not present

## 2020-07-17 DIAGNOSIS — F3181 Bipolar II disorder: Secondary | ICD-10-CM

## 2020-07-17 MED ORDER — ALPRAZOLAM 0.5 MG PO TBDP: 0.5000 mg | ORAL_TABLET | Freq: Once | ORAL | 3 refills | Status: DC | PRN

## 2020-07-17 NOTE — Patient Instructions (Signed)
https://www.nimh.nih.gov/health/topics/anxiety-disorders/index.shtml">  Panic Attack A panic attack is when you suddenly feel very afraid, uncomfortable, or nervous (anxious). A panic attack can happen when you are scared or for no reason. A panic attack can feel like a serious problem. It can even feel like a heart attack or stroke. See your doctor when you have a panic attack to make sure you do not have a serious problem. Follow these instructions at home:  Take medicines only as told by your doctor.  If you feel worried or nervous, try not to have caffeine.  Take good care of your health. To do this: ? Eat healthy. Make sure to eat fresh fruits and vegetables, whole grains, lean meats, and low-fat dairy. ? Get enough sleep. Try to sleep for 7-8 hours each night. ? Exercise. Try to be active for 30 minutes 5 or more days a week. ? Do not smoke. Talk to your doctor if you need help quitting. ? Limit how much alcohol you drink:  If you are a woman who is not pregnant: try not to have more than 1 drink a day.  If you are a man: try not to have more than 2 drinks a day.  One drink equals 12 oz of beer, 5 oz of wine, or 1 oz of hard liquor.  Keep all follow-up visits as told by your doctor. This is important.   Contact a doctor if:  Your symptoms do not get better.  Your symptoms get worse.  You are not able to take your medicines as told. Get help right away if:  You have thoughts of hurting yourself or others.  You have symptoms of a panic attack. Do not drive yourself to the hospital. Have someone else drive you or call an ambulance. If you feel like you may hurt yourself or others, or have thoughts about taking your own life, get help right away. You can go to your nearest emergency department or call:  Your local emergency services (911 in the [place]  A suicide crisis helpline, such as the National Suicide Prevention Lifeline at 1-800-273-8255. This is open 24 hours a  day. Summary  A panic attack is when you suddenly feel very afraid, uncomfortable, or nervous (anxious).  See your doctor when you have a panic attack to make sure that you do not have another serious problem.  If you feel like you may hurt yourself or others, get help right away by calling 911. This information is not intended to replace advice given to you by your health care provider. Make sure you discuss any questions you have with your health care provider. Document Revised: 09/30/2019 Document Reviewed: 09/30/2019 Elsevier Patient Education  2021 Elsevier Inc.  

## 2020-07-17 NOTE — Progress Notes (Signed)
Subjective:  Patient ID: Carlos Smith, male    DOB: May 04, 1990  Age: 30 y.o. MRN: [id number]  Chief Complaint  Patient presents with  . Anxiety    Patient states he has been having social anxiety. States his heart starts beating fast and he doesn't want to do anything but stay home. States symptoms has been occurring about 4 months.    HPI : social phobia and panic for 4 months. He is married.  Panic attack with "disney on ice".  He is short tempered.  He has manic once.  He is moody, wife thinks he bipolar.  This runs in family. He has racing thoughts at night and gets angry quickly.  Anxiety scale 19, mood disorder 8/13 positive. Current Outpatient Medications on File Prior to Visit  Medication Sig Dispense Refill  . diclofenac (VOLTAREN) 75 MG EC tablet Take 1 tablet (75 mg total) by mouth 2 (two) times daily. 30 tablet 0  . diclofenac Sodium (VOLTAREN) 1 % GEL Apply 4 g topically 4 (four) times daily. 150 g 0  . levothyroxine (SYNTHROID) 25 MCG tablet Take 1 tablet (25 mcg total) by mouth daily. 90 tablet 2  . lisinopril (ZESTRIL) 10 MG tablet Take 1 tablet (10 mg total) by mouth daily. 90 tablet 2  . montelukast (SINGULAIR) 10 MG tablet Take 1 tablet (10 mg total) by mouth daily. 90 tablet 1   No current facility-administered medications on file prior to visit.   Past Medical History:  Diagnosis Date  . Gastroesophageal reflux disease   . Hypertension   . Hypothyroidism    Past Surgical History:  Procedure Laterality Date  . NASAL SEPTUM SURGERY  05/2018    Family History  Problem Relation Age of Onset  . Hypertension Mother   . Hypertension Father   . Migraines Father   . Cerebrovascular Accident Father   . Stroke Father   . Hypertension Sister   . Hypertension Brother    Social History   Socioeconomic History  . Marital status: Married    Spouse name: Not on file  . Number of children: Not on file  . Years of education: Not on file  . Highest education level:  Not on file  Occupational History  . Not on file  Tobacco Use  . Smoking status: Never Smoker  . Smokeless tobacco: Former Neurosurgeon    Types: Chew  Substance and Sexual Activity  . Alcohol use: Yes    Alcohol/week: 4.0 standard drinks    Types: 4 Cans of beer per week    Comment: twice a week  . Drug use: No  . Sexual activity: Yes    Partners: Female  Other Topics Concern  . Not on file  Social History Narrative  . Not on file   Social Determinants of Health   Financial Resource Strain: Not on file  Food Insecurity: Not on file  Transportation Needs: Not on file  Physical Activity: Not on file  Stress: Not on file  Social Connections: Not on file    Review of Systems  Constitutional: Negative for activity change and appetite change.  HENT: Negative for congestion and sinus pain.   Eyes: Negative for visual disturbance.  Respiratory: Negative for chest tightness and shortness of breath.   Cardiovascular: Negative for chest pain, palpitations and leg swelling.  Gastrointestinal: Negative for abdominal distention and abdominal pain.  Endocrine: Negative for polyuria.  Genitourinary: Negative for difficulty urinating and dysuria.  Musculoskeletal: Negative for arthralgias and back pain.  Skin: Negative.   Neurological: Negative.   Psychiatric/Behavioral: Positive for agitation.     Objective:  BP 130/80 (BP Location: Right Arm, Patient Position: Sitting, Cuff Size: Normal)   Pulse 94   Temp 97.6 F (36.4 C) (Temporal)   Resp 16   Ht 5\' 10"  (1.778 m)   Wt 168 lb 3.2 oz (76.3 kg)   SpO2 98%   BMI 24.13 kg/m   BP/Weight 07/17/2020 05/15/2020 05/09/2020  Systolic BP 130 110 126  Diastolic BP 80 70 88  Wt. (Lbs) 168.2 176 181.4  BMI 24.13 25.25 26.03    Physical Exam Vitals reviewed.  Constitutional:      Appearance: Normal appearance.  HENT:     Head: Normocephalic and atraumatic.     Right Ear: Tympanic membrane normal.     Left Ear: Tympanic membrane normal.      Mouth/Throat:     Mouth: Mucous membranes are moist.     Pharynx: Oropharynx is clear.  Eyes:     Extraocular Movements: Extraocular movements intact.     Conjunctiva/sclera: Conjunctivae normal.     Pupils: Pupils are equal, round, and reactive to light.  Cardiovascular:     Rate and Rhythm: Normal rate and regular rhythm.     Pulses: Normal pulses.     Heart sounds: Normal heart sounds. No murmur heard. No gallop.   Pulmonary:     Effort: Pulmonary effort is normal. No respiratory distress.     Breath sounds: Normal breath sounds. No rales.  Abdominal:     General: Abdomen is flat. Bowel sounds are normal. There is no distension.     Palpations: Abdomen is soft.     Tenderness: There is no abdominal tenderness.  Musculoskeletal:        General: Normal range of motion.     Cervical back: Normal range of motion and neck supple.  Skin:    General: Skin is warm.     Capillary Refill: Capillary refill takes less than 2 seconds.  Neurological:     General: No focal deficit present.     Mental Status: He is alert and oriented to person, place, and time.      Depression screen PHQ 2/9 10/25/2019  Decreased Interest 0  Down, Depressed, Hopeless 0  PHQ - 2 Score 0     Lab Results  Component Value Date   WBC 9.1 05/09/2020   HGB 16.1 05/09/2020   HCT 45.0 05/09/2020   PLT 336 05/09/2020   GLUCOSE 88 05/09/2020   CHOL 163 06/18/2017   TRIG 140 06/18/2017   HDL 38 (L) 06/18/2017   LDLCALC 97 06/18/2017   ALT 28 05/09/2020   AST 19 05/09/2020   NA 138 05/09/2020   K 4.5 05/09/2020   CL 98 05/09/2020   CREATININE 0.86 05/09/2020   BUN 7 05/09/2020   CO2 23 05/09/2020   TSH 1.560 06/18/2017      Assessment & Plan:   Diagnoses and all orders for this visit: Panic disorder -     ALPRAZolam (NIRAVAM) 0.5 MG dissolvable tablet; Take 1 tablet (0.5 mg total) by mouth once as needed for up to 1 dose for anxiety (panic attack). Patient is having panic attacks and is  ding avoidance to cope.  Use alprazolam with panic attack.  We discussed control over panic. Bipolar 2 disorder (HCC) I believe patient has bipolar 2, start vraylar 1.5mg  #28 samples       Follow-up: Return in about 2 weeks (around  07/31/2020).  An After Visit Summary was printed and given to the patient.  Brent Bulla, MD [facility] [phone]

## 2020-07-25 ENCOUNTER — Other Ambulatory Visit: Payer: Self-pay

## 2020-07-25 ENCOUNTER — Encounter: Payer: Self-pay | Admitting: Sports Medicine

## 2020-07-25 ENCOUNTER — Ambulatory Visit (INDEPENDENT_AMBULATORY_CARE_PROVIDER_SITE_OTHER): Payer: BC Managed Care – PPO | Admitting: Sports Medicine

## 2020-07-25 DIAGNOSIS — M216X2 Other acquired deformities of left foot: Secondary | ICD-10-CM

## 2020-07-25 DIAGNOSIS — M79672 Pain in left foot: Secondary | ICD-10-CM

## 2020-07-25 DIAGNOSIS — M7662 Achilles tendinitis, left leg: Secondary | ICD-10-CM

## 2020-07-25 NOTE — Progress Notes (Signed)
Subjective: Carlos Smith is a 30 y.o. male patient who presents to office for follow-up evaluation of left heel pain.  Patient reports that his boot still rubs the back of the heel and states that he has some pain still.  Patient states that he did not get a chance to buy the night splint as our previous directed.  No other pedal complaints noted.  Patient Active Problem List   Diagnosis Date Noted  . Panic disorder 07/17/2020  . Bipolar 2 disorder (HCC) 07/17/2020  . Celiac disease 05/15/2020  . Erythema nodosum 05/15/2020  . Chronic diarrhea 05/09/2020  . Pharyngitis 11/09/2019  . Acute sinusitis 08/31/2019  . Abnormal stress test 06/18/2017  . 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 Gastro-esophageal reflux disease without esophagitis 06/11/2017  . Hypertension 06/11/2017  . Hyperlipidemia 02/08/2014    Current Outpatient Medications on File Prior to Visit  Medication Sig Dispense Refill  . ALPRAZolam (NIRAVAM) 0.5 MG dissolvable tablet Take 1 tablet (0.5 mg total) by mouth once as needed for up to 1 dose for anxiety (panic attack). 30 tablet 3  . ALPRAZolam (XANAX) 0.5 MG tablet Take 0.5 mg by mouth daily as needed.    . diclofenac (VOLTAREN) 75 MG EC tablet Take 1 tablet (75 mg total) by mouth 2 (two) times daily. 30 tablet 0  . diclofenac Sodium (VOLTAREN) 1 % GEL Apply 4 g topically 4 (four) times daily. 150 g 0  . levothyroxine (SYNTHROID) 25 MCG tablet Take 1 tablet (25 mcg total) by mouth daily. 90 tablet 2  . lisinopril (ZESTRIL) 10 MG tablet Take 1 tablet (10 mg total) by mouth daily. 90 tablet 2  . montelukast (SINGULAIR) 10 MG tablet Take 1 tablet (10 mg total) by mouth daily. 90 tablet 1   No current facility-administered medications on file prior to visit.    Allergies  Allergen Reactions  . Augmentin [Amoxicillin-Pot Clavulanate]     Objective:  General: Alert and oriented x3 in no acute distress  Dermatology: Blanchable erythema to the posterior aspect of the left  heel.  No open lesions bilateral lower extremities, no webspace macerations, no ecchymosis bilateral, all nails x 10 are well manicured.  Vascular: Dorsalis Pedis and Posterior Tibial pedal pulses 2/4, Capillary Fill Time 3 seconds, + pedal hair growth bilateral, no edema bilateral lower extremities, Temperature gradient within normal limits.  Neurology: Michaell Cowing sensation intact via light touch bilateral.  Musculoskeletal: Mild to moderate tenderness with palpation at insertion of the Achilles on left, there is mild calcaneal exostosis with mild soft tissue swelling present and decreased ankle rom with knee extending  vs flexed resembling gastroc equnius bilateral, The achilles tendon feels intact with no nodularity or palpable dell, Thompson sign negative unchanged from prior on the left. Assessment and Plan: Problem List Items Addressed This Visit   None   Visit Diagnoses    Achilles tendinitis of left lower extremity    -  Primary   Acquired equinus deformity of left foot       Pain of left heel          -Complete examination performed -Re-Discussed treatment options for likely tendinitis; advised patient if he is no better at next visit should consider physical therapy, MRI, or EPAT therapy -Advised patient to continue with cam boot and use of Achilles sleeve is provided at this visit -Continue with diclofenac to take by mouth as instructed for pain and inflammation -Continue with diclofenac topical to use as instructed for localized pain and  inflammation -Advised gentle stretching and combination of heat and ice -Advised patient to get night splint over-the-counter to use as instructed -Patient approved for medical leave through 09/10/2020 -Patient to return to office as scheduled or sooner if condition worsens.  Asencion Islam, DPM

## 2020-07-25 NOTE — Patient Instructions (Signed)
Night splint get off [employer]

## 2020-07-26 ENCOUNTER — Telehealth (INDEPENDENT_AMBULATORY_CARE_PROVIDER_SITE_OTHER): Payer: BC Managed Care – PPO | Admitting: Legal Medicine

## 2020-07-26 ENCOUNTER — Encounter: Payer: Self-pay | Admitting: Legal Medicine

## 2020-07-26 VITALS — Ht 70.0 in | Wt 165.0 lb

## 2020-07-26 DIAGNOSIS — K529 Noninfective gastroenteritis and colitis, unspecified: Secondary | ICD-10-CM

## 2020-07-26 DIAGNOSIS — Z9189 Other specified personal risk factors, not elsewhere classified: Secondary | ICD-10-CM

## 2020-07-26 MED ORDER — AZITHROMYCIN 250 MG PO TABS: ORAL_TABLET | ORAL | 0 refills | Status: DC

## 2020-07-26 MED ORDER — PREDNISONE 10 MG (21) PO TBPK: ORAL_TABLET | ORAL | 0 refills | Status: DC

## 2020-07-26 MED ORDER — DIPHENOXYLATE-ATROPINE 2.5-0.025 MG PO TABS: 1.0000 | ORAL_TABLET | Freq: Four times a day (QID) | ORAL | 0 refills | Status: DC | PRN

## 2020-07-26 MED ORDER — CHERATUSSIN AC 100-10 MG/5ML PO SOLN: 5.0000 mL | Freq: Three times a day (TID) | ORAL | 0 refills | Status: DC | PRN

## 2020-07-26 NOTE — Progress Notes (Signed)
Virtual Visit via Video Note   This visit type was conducted due to national recommendations for restrictions regarding the COVID-19 Pandemic (e.g. social distancing) in an effort to limit this patient's exposure and mitigate transmission in our community.  Due to his co-morbid illnesses, this patient is at least at moderate risk for complications without adequate follow up.  This format is felt to be most appropriate for this patient at this time.  All issues noted in this document were discussed and addressed.  A limited physical exam was performed with this format.  A verbal consent was obtained for the virtual visit.   Date:  07/26/2020   ID:  Carlos Smith, DOB 03/17/1991, MRN [id number]  Patient Location: Home Provider Location: Office/Clinic  PCP:  Abigail Miyamoto, MD   Evaluation Performed:  New Patient Evaluation  Chief Complaint:  Nausea, loss of appetite, diarrhea, possible elevated temp, body aches, fatigue, sore throat  States he has only peed once in last day. Can drink water but has diarrhea after. Started Vraylar on 4/4. Was given samples in office.   Pt states wife works in Biomedical scientist and doctor there has new covid strain. Wife is asymptomatic.   History of Present Illness:    Carlos Smith is a 30 y.o. male with no appetite. Not eating, cough, tired, exposed to covid, he is having diarrhea  The patient does have symptoms concerning for COVID-19 infection (fever, chills, cough, or new shortness of breath).    Past Medical History:  Diagnosis Date  . Gastroesophageal reflux disease   . Hypertension   . Hypothyroidism     Past Surgical History:  Procedure Laterality Date  . NASAL SEPTUM SURGERY  05/2018    Family History  Problem Relation Age of Onset  . Hypertension Mother   . Hypertension Father   . Migraines Father   . Cerebrovascular Accident Father   . Stroke Father   . Hypertension Sister   . Hypertension Brother     Social  History   Socioeconomic History  . Marital status: Married    Spouse name: Not on file  . Number of children: Not on file  . Years of education: Not on file  . Highest education level: Not on file  Occupational History  . Not on file  Tobacco Use  . Smoking status: Never Smoker  . Smokeless tobacco: Former Neurosurgeon    Types: Chew  Substance and Sexual Activity  . Alcohol use: Yes    Alcohol/week: 4.0 standard drinks    Types: 4 Cans of beer per week    Comment: twice a week  . Drug use: No  . Sexual activity: Yes    Partners: Female  Other Topics Concern  . Not on file  Social History Narrative  . Not on file   Social Determinants of Health   Financial Resource Strain: Not on file  Food Insecurity: Not on file  Transportation Needs: Not on file  Physical Activity: Not on file  Stress: Not on file  Social Connections: Not on file  Intimate Partner Violence: Not on file    Outpatient Medications Prior to Visit  Medication Sig Dispense Refill  . ALPRAZolam (NIRAVAM) 0.5 MG dissolvable tablet Take 1 tablet (0.5 mg total) by mouth once as needed for up to 1 dose for anxiety (panic attack). 30 tablet 3  . ALPRAZolam (XANAX) 0.5 MG tablet Take 0.5 mg by mouth daily as needed.    . diclofenac (VOLTAREN) 75 MG EC  tablet Take 1 tablet (75 mg total) by mouth 2 (two) times daily. 30 tablet 0  . diclofenac Sodium (VOLTAREN) 1 % GEL Apply 4 g topically 4 (four) times daily. 150 g 0  . levothyroxine (SYNTHROID) 25 MCG tablet Take 1 tablet (25 mcg total) by mouth daily. 90 tablet 2  . lisinopril (ZESTRIL) 10 MG tablet Take 1 tablet (10 mg total) by mouth daily. 90 tablet 2  . montelukast (SINGULAIR) 10 MG tablet Take 1 tablet (10 mg total) by mouth daily. 90 tablet 1   No facility-administered medications prior to visit.    Allergies:   Augmentin [amoxicillin-pot clavulanate]   Social History   Tobacco Use  . Smoking status: Never Smoker  . Smokeless tobacco: Former Neurosurgeon     Types: Chew  Substance Use Topics  . Alcohol use: Yes    Alcohol/week: 4.0 standard drinks    Types: 4 Cans of beer per week    Comment: twice a week  . Drug use: No     Review of Systems  Constitutional: Negative for chills and fever.  HENT: Positive for congestion.   Respiratory: Positive for cough. Negative for sputum production.   Cardiovascular: Negative for palpitations and leg swelling.  Gastrointestinal: Positive for diarrhea.  Genitourinary: Negative for dysuria.  Musculoskeletal: Positive for myalgias.  Neurological: Positive for headaches.     Labs/Other Tests and Data Reviewed:    Recent Labs: 05/09/2020: ALT 28; BUN 7; Creatinine, Ser 0.86; Hemoglobin 16.1; Platelets 336; Potassium 4.5; Sodium 138   Recent Lipid Panel Lab Results  Component Value Date/Time   CHOL 163 06/18/2017 09:53 AM   TRIG 140 06/18/2017 09:53 AM   HDL 38 (L) 06/18/2017 09:53 AM   CHOLHDL 4.3 06/18/2017 09:53 AM   LDLCALC 97 06/18/2017 09:53 AM    Wt Readings from Last 3 Encounters:  07/17/20 168 lb 3.2 oz (76.3 kg)  05/15/20 176 lb (79.8 kg)  05/09/20 181 lb 6.4 oz (82.3 kg)     Objective:    Vital Signs:  There were no vitals taken for this visit.   Physical Exam reviewed  ASSESSMENT & PLAN:   Diagnoses and all orders for this visit: Gastroenteritis -     diphenoxylate-atropine (LOMOTIL) 2.5-0.025 MG tablet; Take 1 tablet by mouth 4 (four) times daily as needed for diarrhea or loose stools. I sent in lomotil for his gastroenteritis At increased risk of exposure to COVID-19 virus -     POC COVID-19- negative test Other orders -     azithromycin (ZITHROMAX) 250 MG tablet; 2 tablets on day 1, then 1 tablet daily on days 2-6. -     predniSONE (STERAPRED UNI-PAK 21 TAB) 10 MG (21) TBPK tablet; Take 6ills first day , then 5 pills day 2 and then cut down one pill day until gone -     guaiFENesin-codeine (CHERATUSSIN AC) 100-10 MG/5ML syrup; Take 5 mLs by mouth 3 (three) times daily  as needed.        COVID-19 Education: The signs and symptoms of COVID-19 were discussed with the patient and how to seek care for testing (follow up with PCP or arrange E-visit). The importance of social distancing was discussed today.   I spent 20 minutes dedicated to the care of this patient on the date of this encounter to include face-to-face time with the patient, as well as:   Follow Up:  In Person prn  Signed, Humberto Leep, [facility]  07/26/2020 7:44 AM  [place]

## 2020-07-31 ENCOUNTER — Ambulatory Visit (INDEPENDENT_AMBULATORY_CARE_PROVIDER_SITE_OTHER): Payer: BC Managed Care – PPO | Admitting: Legal Medicine

## 2020-07-31 ENCOUNTER — Other Ambulatory Visit: Payer: Self-pay

## 2020-07-31 ENCOUNTER — Encounter: Payer: Self-pay | Admitting: Legal Medicine

## 2020-07-31 VITALS — BP 128/80 | HR 95 | Temp 98.3°F | Resp 16 | Ht 70.0 in | Wt 171.0 lb

## 2020-07-31 DIAGNOSIS — F410 Panic disorder [episodic paroxysmal anxiety] without agoraphobia: Secondary | ICD-10-CM | POA: Diagnosis not present

## 2020-07-31 DIAGNOSIS — F3181 Bipolar II disorder: Secondary | ICD-10-CM | POA: Diagnosis not present

## 2020-07-31 MED ORDER — CARIPRAZINE HCL 1.5 MG PO CAPS: 1.5000 mg | ORAL_CAPSULE | Freq: Every day | ORAL | 3 refills | Status: DC

## 2020-07-31 MED ORDER — FLUOXETINE HCL 20 MG PO TABS: 20.0000 mg | ORAL_TABLET | Freq: Every day | ORAL | 2 refills | Status: DC

## 2020-07-31 MED ORDER — ARIPIPRAZOLE 10 MG PO TBDP: 10.0000 mg | ORAL_TABLET | Freq: Every day | ORAL | 3 refills | Status: DC

## 2020-07-31 NOTE — Progress Notes (Signed)
Subjective:  Patient ID: Carlos Smith, male    DOB: [birth year]  Age: 30 y.o. MRN: [id number]  Chief Complaint  Patient presents with  . Anxiety    Patient is taking vraylar (samples) and He did not feel any different, and medication do not let him to sleep.    HPI: follw up bipolar  Poor sleep, still racing thoughts.  vraylar not helping sleep.  He is tired during day. Blurred vision.  Less moody. vraylar is too expensive.   Current Outpatient Medications on File Prior to Visit  Medication Sig Dispense Refill  . ALPRAZolam (XANAX) 0.5 MG tablet Take 0.5 mg by mouth daily as needed.    . diclofenac (VOLTAREN) 75 MG EC tablet Take 1 tablet (75 mg total) by mouth 2 (two) times daily. 30 tablet 0  . diclofenac Sodium (VOLTAREN) 1 % GEL Apply 4 g topically 4 (four) times daily. 150 g 0  . levothyroxine (SYNTHROID) 25 MCG tablet Take 1 tablet (25 mcg total) by mouth daily. 90 tablet 2  . lisinopril (ZESTRIL) 10 MG tablet Take 1 tablet (10 mg total) by mouth daily. 90 tablet 2  . montelukast (SINGULAIR) 10 MG tablet Take 1 tablet (10 mg total) by mouth daily. 90 tablet 1   No current facility-administered medications on file prior to visit.   Past Medical History:  Diagnosis Date  . Gastroesophageal reflux disease   . Hypertension   . Hypothyroidism    Past Surgical History:  Procedure Laterality Date  . NASAL SEPTUM SURGERY  05/2018    Family History  Problem Relation Age of Onset  . Hypertension Mother   . Hypertension Father   . Migraines Father   . Cerebrovascular Accident Father   . Stroke Father   . Hypertension Sister   . Hypertension Brother    Social History   Socioeconomic History  . Marital status: Married    Spouse name: Not on file  . Number of children: Not on file  . Years of education: Not on file  . Highest education level: Not on file  Occupational History  . Not on file  Tobacco Use  . Smoking status: Never Smoker  . Smokeless tobacco: Former Neurosurgeon     Types: Chew  Substance and Sexual Activity  . Alcohol use: Yes    Alcohol/week: 4.0 standard drinks    Types: 4 Cans of beer per week    Comment: twice a week  . Drug use: No  . Sexual activity: Yes    Partners: Female  Other Topics Concern  . Not on file  Social History Narrative  . Not on file   Social Determinants of Health   Financial Resource Strain: Not on file  Food Insecurity: Not on file  Transportation Needs: Not on file  Physical Activity: Not on file  Stress: Not on file  Social Connections: Not on file    Review of Systems  Constitutional: Negative.   HENT: Negative for congestion and sinus pain.   Respiratory: Negative for chest tightness and shortness of breath.   Cardiovascular: Negative for chest pain, palpitations and leg swelling.  Gastrointestinal: Negative for abdominal distention and abdominal pain.  Endocrine: Positive for polyuria.  Genitourinary: Negative for difficulty urinating, dysuria and urgency.  Musculoskeletal: Negative for arthralgias and back pain.  Neurological: Negative.   Psychiatric/Behavioral: Positive for agitation.     Objective:  BP 128/80   Pulse 95   Temp 98.3 F (36.8 C)   Resp 16  Ht 5\' 10"  (1.778 m)   Wt 171 lb (77.6 kg)   SpO2 98%   BMI 24.54 kg/m   BP/Weight 07/31/2020 07/26/2020 07/17/2020  Systolic BP 128 - 130  Diastolic BP 80 - 80  Wt. (Lbs) 171 165 168.2  BMI 24.54 23.68 24.13    Physical Exam Vitals reviewed.  Constitutional:      Appearance: Normal appearance. He is normal weight.  HENT:     Head: Normocephalic.     Right Ear: Tympanic membrane, ear canal and external ear normal.     Left Ear: Ear canal and external ear normal.     Nose: Nose normal.     Mouth/Throat:     Mouth: Mucous membranes are moist.     Pharynx: Oropharynx is clear.  Eyes:     Extraocular Movements: Extraocular movements intact.     Conjunctiva/sclera: Conjunctivae normal.     Pupils: Pupils are equal, round, and  reactive to light.  Cardiovascular:     Rate and Rhythm: Normal rate and regular rhythm.     Pulses: Normal pulses.     Heart sounds: Normal heart sounds. No murmur heard. No gallop.   Pulmonary:     Effort: Pulmonary effort is normal. No respiratory distress.     Breath sounds: No rales.  Abdominal:     General: Abdomen is flat. Bowel sounds are normal. There is no distension.     Palpations: Abdomen is soft.     Tenderness: There is no abdominal tenderness.  Musculoskeletal:        General: Normal range of motion.     Cervical back: Normal range of motion.  Skin:    General: Skin is warm.     Capillary Refill: Capillary refill takes less than 2 seconds.  Neurological:     General: No focal deficit present.     Mental Status: He is alert and oriented to person, place, and time.       Lab Results  Component Value Date   WBC 9.1 05/09/2020   HGB 16.1 05/09/2020   HCT 45.0 05/09/2020   PLT 336 05/09/2020   GLUCOSE 88 05/09/2020   CHOL 163 06/18/2017   TRIG 140 06/18/2017   HDL 38 (L) 06/18/2017   LDLCALC 97 06/18/2017   ALT 28 05/09/2020   AST 19 05/09/2020   NA 138 05/09/2020   K 4.5 05/09/2020   CL 98 05/09/2020   CREATININE 0.86 05/09/2020   BUN 7 05/09/2020   CO2 23 05/09/2020   TSH 1.560 06/18/2017      Assessment & Plan:   1. Panic disorder Panic disorder is doing well with xanax  2. Bipolar 2 disorder [facility]) Patient is still having problems with his mania and still has racing thoughts and poor sleep.  He still is moody.  Start on fluoxetine and aripirazole.         Follow-up: Return in about 1 month (around 08/30/2020) for bipolar.  An After Visit Summary was printed and given to the patient.  09/01/2020, MD [facility] [phone]

## 2020-08-01 ENCOUNTER — Other Ambulatory Visit: Payer: Self-pay

## 2020-08-01 MED ORDER — FLUOXETINE HCL 20 MG PO CAPS: 20.0000 mg | ORAL_CAPSULE | Freq: Every day | ORAL | 1 refills | Status: DC

## 2020-08-07 ENCOUNTER — Telehealth: Payer: Self-pay

## 2020-08-07 NOTE — Telephone Encounter (Signed)
Pt calling stating he is restless. States he can drive about 10 minutes and feels like he needs to get out and run beside the car. Says he cant sit still for too long. States he has been having diarrhea. Please advise.   Lorita Officer, CCMA 08/07/20 2:20 PM

## 2020-08-07 NOTE — Telephone Encounter (Signed)
His mania has gotten worse, recommend mental health emergency services lp

## 2020-08-07 NOTE — Telephone Encounter (Signed)
Patient was informed. He made an appointment to see Dr Marina Goodell tomorrow morning.

## 2020-08-08 ENCOUNTER — Other Ambulatory Visit: Payer: Self-pay

## 2020-08-08 ENCOUNTER — Encounter: Payer: Self-pay | Admitting: Legal Medicine

## 2020-08-08 ENCOUNTER — Ambulatory Visit (HOSPITAL_COMMUNITY)
Admission: RE | Admit: 2020-08-08 | Discharge: 2020-08-08 | Disposition: A | Discharge status: Home / Self Care | Payer: BC Managed Care – PPO | Attending: Psychiatry | Admitting: Psychiatry

## 2020-08-08 ENCOUNTER — Ambulatory Visit (INDEPENDENT_AMBULATORY_CARE_PROVIDER_SITE_OTHER): Payer: BC Managed Care – PPO | Admitting: Legal Medicine

## 2020-08-08 VITALS — BP 120/60 | HR 111 | Temp 98.1°F | Resp 16 | Ht 70.0 in | Wt 166.0 lb

## 2020-08-08 DIAGNOSIS — F419 Anxiety disorder, unspecified: Secondary | ICD-10-CM | POA: Diagnosis present

## 2020-08-08 DIAGNOSIS — F3181 Bipolar II disorder: Secondary | ICD-10-CM | POA: Insufficient documentation

## 2020-08-08 DIAGNOSIS — Z79899 Other long term (current) drug therapy: Secondary | ICD-10-CM | POA: Diagnosis not present

## 2020-08-08 NOTE — H&P (Signed)
Behavioral Health Medical Screening Exam  Carlos Smith is a 30 y.o. male patient presented to [facility] as a walk in  accompanied by himself with complaints of "I just feel so anxious".  [name], 30 y.o., male patient seen face to face by this provider, consulted with Dr. Lucianne Muss, and chart reviewed on 08/08/20.  On evaluation Carlos Smith reports he has been dealing with anxiety since December. States that the PCP diagnosed him with Bipolar 2.   During evaluation Carlos Smith is sitting in no acute distress. He is alert, oriented x 4. He states he is anxious but he is calm and cooperative during the evaluation. States his mood is good most of the time. Affect is congruent.  Reports at times he has some depression but it is the anxiety that is most bothersome. Reports decreased sleep and appetite. He does not appear to be responding to internal/external stimuli or delusional thoughts.  Patient denies suicidal/self-harm/homicidal ideation, psychosis, and paranoia.  Patient answered question appropriately.    Patient states he has employment. Lives with his spouse and 15 year old daughter.   Patient reports his PCP prescribes Abilify, Fluoxetine, and Xanax. Educated patient on medication side effects and recommended he follow up with PCP for medication management. Discussed possibly seeing a Psychiatrist, and patient was willing to engage. SW provided resources.    Total Time spent with patient: 15 minutes  Psychiatric Specialty Exam:  Presentation  General Appearance: Appropriate for Environment  Eye Contact:Good  Speech:Clear and Coherent; Normal Rate  Speech Volume:Normal  Handedness:Right   Mood and Affect  Mood:Anxious  Affect:Appropriate; Congruent   Thought Process  Thought Processes:Coherent  Descriptions of Associations:Intact  Orientation:Full (Time, Place and Person)  Thought Content:Logical  History of Schizophrenia/Schizoaffective disorder:No data  recorded Duration of Psychotic Symptoms:No data recorded Hallucinations:Hallucinations: None  Ideas of Reference:None  Suicidal Thoughts:Suicidal Thoughts: No  Homicidal Thoughts:Homicidal Thoughts: No   Sensorium  Memory:Immediate Good; Recent Good; Remote Good  Judgment:Good  Insight:Good   Executive Functions  Concentration:Good  Attention Span:Good  Recall:Good  Fund of Knowledge:Good  Language:Good   Psychomotor Activity  Psychomotor Activity:Psychomotor Activity: Normal   Assets  Assets:Communication Skills; Desire for Improvement; Financial Resources/Insurance; Housing; Intimacy; Physical Health; Resilience; Social Support; Vocational/Educational; Transportation   Sleep  Sleep:Sleep: Fair Number of Hours of Sleep: 5    Physical Exam: Physical Exam Constitutional:      Appearance: He is normal weight.  HENT:     Head: Normocephalic.     Right Ear: Tympanic membrane normal.     Left Ear: Tympanic membrane normal.     Nose: Nose normal.     Mouth/Throat:     Mouth: Mucous membranes are dry.     Pharynx: Oropharynx is clear.  Eyes:     Extraocular Movements: Extraocular movements intact.  Cardiovascular:     Rate and Rhythm: Normal rate.     Pulses: Normal pulses.  Pulmonary:     Effort: Pulmonary effort is normal.  Abdominal:     Tenderness: There is no guarding.  Musculoskeletal:        General: Normal range of motion.     Cervical back: Normal range of motion.  Skin:    General: Skin is warm and dry.     Capillary Refill: Capillary refill takes less than 2 seconds.  Neurological:     Mental Status: He is alert and oriented to person, place, and time.  Psychiatric:        Attention and Perception: Attention  normal.        Mood and Affect: Mood is anxious.        Speech: Speech normal.        Behavior: Behavior normal. Behavior is cooperative.        Thought Content: Thought content normal.        Cognition and Memory: Cognition  normal.        Judgment: Judgment normal.    ROS There were no vitals taken for this visit. There is no height or weight on file to calculate BMI.  Musculoskeletal: Strength & Muscle Tone: within normal limits Gait & Station: normal Patient leans: N/A   Recommendations:  Based on my evaluation the patient does not appear to have an emergency medical condition. Recommend follow up with PCP Dr. Brent Bulla to review medications and medication management.   SW will provide out patient resources for Psychiatrist in his area.   Ardis Hughs, NP 08/08/2020, 10:21 AM

## 2020-08-08 NOTE — Progress Notes (Signed)
Subjective:  Patient ID: Carlos Smith, male    DOB: 01/12/1991  Age: 30 y.o. MRN: [id number]  Chief Complaint  Patient presents with  . Manic Behavior    HPI: bipolar disorder, not doing well on fluoxetine and abilify.  He stayed home yesterday.  He was unable to go to [employer], He is agitated .  He is using xanax to sleep only. He is on vraylar for 5 days.     Current Outpatient Medications on File Prior to Visit  Medication Sig Dispense Refill  . ALPRAZolam (XANAX) 0.5 MG tablet Take 0.5 mg by mouth daily as needed.    [name] aripiprazole (ABILIFY) 10 MG disintegrating tablet Take 1 tablet (10 mg total) by mouth daily. 30 tablet 3  . diclofenac (VOLTAREN) 75 MG EC tablet Take 1 tablet (75 mg total) by mouth 2 (two) times daily. 30 tablet 0  . diclofenac Sodium (VOLTAREN) 1 % GEL Apply 4 g topically 4 (four) times daily. 150 g 0  . FLUoxetine (PROZAC) 20 MG capsule Take 1 capsule (20 mg total) by mouth daily. 90 capsule 1  . levothyroxine (SYNTHROID) 25 MCG tablet Take 1 tablet (25 mcg total) by mouth daily. 90 tablet 2  . lisinopril (ZESTRIL) 10 MG tablet Take 1 tablet (10 mg total) by mouth daily. 90 tablet 2  . montelukast (SINGULAIR) 10 MG tablet Take 1 tablet (10 mg total) by mouth daily. 90 tablet 1   No current facility-administered medications on file prior to visit.   Past Medical History:  Diagnosis Date  . Gastroesophageal reflux disease   . Hypertension   . Hypothyroidism    Past Surgical History:  Procedure Laterality Date  . NASAL SEPTUM SURGERY  05/2018    Family History  Problem Relation Age of Onset  . Hypertension Mother   . Hypertension Father   . Migraines Father   . Cerebrovascular Accident Father   . Stroke Father   . Hypertension Sister   . Hypertension Brother    Social History   Socioeconomic History  . Marital status: Married    Spouse name: Not on file  . Number of children: Not on file  . Years of education: Not on file  . Highest  education level: Not on file  Occupational History  . Not on file  Tobacco Use  . Smoking status: Never Smoker  . Smokeless tobacco: Former Neurosurgeon    Types: Chew  Substance and Sexual Activity  . Alcohol use: Yes    Alcohol/week: 4.0 standard drinks    Types: 4 Cans of beer per week    Comment: twice a week  . Drug use: No  . Sexual activity: Yes    Partners: Female  Other Topics Concern  . Not on file  Social History Narrative  . Not on file   Social Determinants of Health   Financial Resource Strain: Not on file  Food Insecurity: Not on file  Transportation Needs: Not on file  Physical Activity: Not on file  Stress: Not on file  Social Connections: Not on file    Review of Systems  Constitutional: Negative for activity change and appetite change.  HENT: Negative for congestion and rhinorrhea.   Eyes: Negative for visual disturbance.  Respiratory: Negative for shortness of breath.   Cardiovascular: Negative for chest pain and palpitations.  Gastrointestinal: Negative for abdominal distention and abdominal pain.  Genitourinary: Negative for difficulty urinating, dysuria and urgency.  Musculoskeletal: Negative for arthralgias and back pain.  Neurological: Negative.   Psychiatric/Behavioral: Positive for agitation.     Objective:  BP 120/60   Pulse (!) 111   Temp 98.1 F (36.7 C)   Resp 16   Ht 5\' 10"  (1.778 m)   Wt 166 lb (75.3 kg)   SpO2 98%   BMI 23.82 kg/m   BP/Weight 08/08/2020 07/31/2020 07/26/2020  Systolic BP 120 128 -  Diastolic BP 60 80 -  Wt. (Lbs) 166 171 165  BMI 23.82 24.54 23.68    Physical Exam Vitals reviewed.  Constitutional:      Appearance: Normal appearance.  Cardiovascular:     Rate and Rhythm: Normal rate and regular rhythm.     Pulses: Normal pulses.     Heart sounds: Normal heart sounds. No gallop.   Pulmonary:     Effort: Pulmonary effort is normal.  Musculoskeletal:     Cervical back: Normal range of motion and neck supple.   Neurological:     Mental Status: He is alert.  Psychiatric:     Comments: Agitated, unable to sleep       Lab Results  Component Value Date   WBC 9.1 05/09/2020   HGB 16.1 05/09/2020   HCT 45.0 05/09/2020   PLT 336 05/09/2020   GLUCOSE 88 05/09/2020   CHOL 163 06/18/2017   TRIG 140 06/18/2017   HDL 38 (L) 06/18/2017   LDLCALC 97 06/18/2017   ALT 28 05/09/2020   AST 19 05/09/2020   NA 138 05/09/2020   K 4.5 05/09/2020   CL 98 05/09/2020   CREATININE 0.86 05/09/2020   BUN 7 05/09/2020   CO2 23 05/09/2020   TSH 1.560 06/18/2017      Assessment & Plan:   1. Bipolar 2 disorder (HCC) Patient in crises and not taken any medicines today, refer to [facility] mental healthcare for emergency evaluation        Follow-up: Return in about 1 month (around 09/07/2020).  An After Visit Summary was printed and given to the patient.  09/09/2020, MD [facility] [phone]

## 2020-08-08 NOTE — BH Assessment (Signed)
Comprehensive Clinical Assessment (CCA) Note  08/08/2020 Carlos Smith [id number]   Patient is a 30 year old male presenting voluntarily to [facility] for assessment after being referred by his PCP, Dr. Sedalia Muta. Patient reports he was recently diagnosed with Bipolar II and has been prescribed Abilify, Prozac, and Xanax. He reports negative side effects including psychomotor agitation, increased irritability and poor sleep. He also reports increased anxiety, especially when in public. He is concerned about his medications and would like to possibly try a new medication. He denies SI/HI/AVH. He denies any substance use or trauma history. Patient states his father is diagnosed with Bipolar disorder as well.  Per Vernard Gambles, PMHNP this patient does not meet in patient care criteria and is psych cleared for d/c. This counselor has provided patient with outpatient mental health resources.  Chief Complaint:  Chief Complaint  Patient presents with  . Psychiatric Evaluation   Visit Diagnosis: F31.81 Bipolar II  CCA Biopsychosocial Intake/Chief Complaint:  NA  Current Symptoms/Problems: NA   Patient Reported Schizophrenia/Schizoaffective Diagnosis in Past: No   Strengths: NA  Preferences: NA  Abilities: NA   Type of Services Patient Feels are Needed: NA   Initial Clinical Notes/Concerns: NA   Mental Health Symptoms Depression:  Change in energy/activity; Difficulty Concentrating; Fatigue; Increase/decrease in appetite; Irritability; Sleep (too much or little); Weight gain/loss; Worthlessness   Duration of Depressive symptoms: Greater than two weeks   Mania:  None   Anxiety:   Restlessness   Psychosis:  None   Duration of Psychotic symptoms: No data recorded  Trauma:  None   Obsessions:  None   Compulsions:  None   Inattention:  None   Hyperactivity/Impulsivity:  N/A   Oppositional/Defiant Behaviors:  N/A   Emotional Irregularity:  Mood lability   Other Mood/Personality  Symptoms:  No data recorded   Mental Status Exam Appearance and self-care  Stature:  Average   Weight:  Average weight   Clothing:  Neat/clean   Grooming:  Normal   Cosmetic use:  None   Posture/gait:  Normal   Motor activity:  Not Remarkable   Sensorium  Attention:  Normal   Concentration:  Normal   Orientation:  X5   Recall/memory:  Normal   Affect and Mood  Affect:  Appropriate; Congruent   Mood:  Dysphoric   Relating  Eye contact:  Normal   Facial expression:  Responsive   Attitude toward examiner:  Cooperative   Thought and Language  Speech flow: Clear and Coherent   Thought content:  Appropriate to Mood and Circumstances   Preoccupation:  None   Hallucinations:  None   Organization:  No data recorded  [employer] of Knowledge:  Good   Intelligence:  Average   Abstraction:  Normal   Judgement:  Fair   Dance movement psychotherapist:  Realistic   Insight:  Fair   Decision Making:  Normal   Social Functioning  Social Maturity:  Responsible   Social Judgement:  Normal   Stress  Stressors:  Work; Illness   Coping Ability:  Deficient supports   Skill Deficits:  None   Supports:  Family     Religion: Religion/Spirituality Are You A Religious Person?: No  Leisure/Recreation: Leisure / Recreation Do You Have Hobbies?: No  Exercise/Diet: Exercise/Diet Do You Exercise?: No Have You Gained or Lost A Significant Amount of Weight in the Past Six Months?: No Do You Follow a Special Diet?: No Do You Have Any Trouble Sleeping?: Yes Explanation of Sleeping Difficulties:  reports 4-5 hours of interuppted sleep at night   CCA Employment/Education Employment/Work Situation: Employment / Work Situation Employment situation: Leave of absence Patient's job has been impacted by current illness: No What is the longest time patient has a held a job?: [employer] Where was the patient employed at that time?: [employer] Has patient ever been in the  [employer]?: No  Education: Education Is Patient Currently Attending School?: No Last Grade Completed: 12 Name of High School: NA Did Garment/textile technologist From [employer]?: Yes Did Theme park manager?: No Did Designer, television/film set?: No Did You Have An Individualized Education Program (IIEP): No Did You Have Any Difficulty At School?: No Patient's Education Has Been Impacted by Current Illness: No   CCA Family/Childhood History Family and Relationship History: Family history Marital status: Married Number of Years Married: 4 What types of issues is patient dealing with in the relationship?: NA Additional relationship information: NA Are you sexually active?: Yes What is your sexual orientation?: heterosexual Has your sexual activity been affected by drugs, alcohol, medication, or emotional stress?: no Does patient have children?: Yes How many children?: 1 How is patient's relationship with their children?: 4 y/o daughter, good relationship  Childhood History:  Childhood History By whom was/is the patient raised?: Both parents Additional childhood history information: NA Description of patient's relationship with caregiver when they were a child: NA Patient's description of current relationship with people who raised him/her: famiy is supportive How were you disciplined when you got in trouble as a child/adolescent?: NA Does patient have siblings?: No Did patient suffer any verbal/emotional/physical/sexual abuse as a child?: No Did patient suffer from severe childhood neglect?: No Has patient ever been sexually abused/assaulted/raped as an adolescent or adult?: No Was the patient ever a victim of a crime or a disaster?: No Witnessed domestic violence?: No Has patient been affected by domestic violence as an adult?: No  Child/Adolescent Assessment:     CCA Substance Use Alcohol/Drug Use: Alcohol / Drug Use Pain Medications: see MAR Prescriptions: see MAR Over the  Counter: see MAR History of alcohol / drug use?: No history of alcohol / drug abuse                         ASAM's:  Six Dimensions of Multidimensional Assessment  Dimension 1:  Acute Intoxication and/or Withdrawal Potential:      Dimension 2:  Biomedical Conditions and Complications:      Dimension 3:  Emotional, Behavioral, or Cognitive Conditions and Complications:     Dimension 4:  Readiness to Change:     Dimension 5:  Relapse, Continued use, or Continued Problem Potential:     Dimension 6:  Recovery/Living Environment:     ASAM Severity Score:    ASAM Recommended Level of Treatment:     Substance use Disorder (SUD)    Recommendations for Services/Supports/Treatments:    DSM5 Diagnoses: Patient Active Problem List   Diagnosis Date Noted  . Panic disorder 07/17/2020  . Bipolar 2 disorder (HCC) 07/17/2020  . Celiac disease 05/15/2020  . Erythema nodosum 05/15/2020  . Chronic diarrhea 05/09/2020  . Pharyngitis 11/09/2019  . Acute sinusitis 08/31/2019  . Abnormal stress test 06/18/2017  . 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 Gastro-esophageal reflux disease without esophagitis 06/11/2017  . Hypertension 06/11/2017  . Hyperlipidemia 02/08/2014    Patient Centered Plan: Patient is on the following Treatment Plan(s):   Referrals to Alternative Service(s): Referred to Alternative Service(s):   Place:  Date:   Time:    Referred to Alternative Service(s):   Place:   Date:   Time:    Referred to Alternative Service(s):   Place:   Date:   Time:    Referred to Alternative Service(s):   Place:   Date:   Time:     Celedonio Miyamoto, LCSW

## 2020-08-15 ENCOUNTER — Telehealth: Payer: Self-pay

## 2020-08-15 ENCOUNTER — Encounter: Payer: Self-pay | Admitting: Sports Medicine

## 2020-08-15 ENCOUNTER — Ambulatory Visit (INDEPENDENT_AMBULATORY_CARE_PROVIDER_SITE_OTHER): Payer: BC Managed Care – PPO | Admitting: Sports Medicine

## 2020-08-15 ENCOUNTER — Other Ambulatory Visit: Payer: Self-pay

## 2020-08-15 DIAGNOSIS — M7662 Achilles tendinitis, left leg: Secondary | ICD-10-CM | POA: Diagnosis not present

## 2020-08-15 DIAGNOSIS — M79672 Pain in left foot: Secondary | ICD-10-CM | POA: Diagnosis not present

## 2020-08-15 DIAGNOSIS — M216X2 Other acquired deformities of left foot: Secondary | ICD-10-CM | POA: Diagnosis not present

## 2020-08-15 NOTE — Telephone Encounter (Signed)
Contacted patient's insurance company to obtained authorization for MRI of Lt ankle w/o contrast. Authorization was required through [employer]IA [phone])  Auth# [id number] Exp:02-11-21 Contacted [place] [facility] and scheduled pt's appt for 08-17-20 for checking in at 11:45 for a 12 appt.  Faxed order form to [facility] Contacted pt and advised him of his appt date and time, pt was instructed of location address and phone number, pt stated understanding

## 2020-08-15 NOTE — Progress Notes (Signed)
Subjective: Carlos Smith is a 30 y.o. male patient who presents to office for follow-up evaluation of left heel pain.  Patient reports that pain is a little better his boot still rubs the back of the heel and states that sometimes he has to take it off because of rubbing.  Patient reports that the night splint has been helpful but still has some pain with direct pressure to the back of the heel.   No other pedal complaints noted.  Patient Active Problem List   Diagnosis Date Noted  . Panic disorder 07/17/2020  . Bipolar 2 disorder (HCC) 07/17/2020  . Celiac disease 05/15/2020  . Erythema nodosum 05/15/2020  . Chronic diarrhea 05/09/2020  . Pharyngitis 11/09/2019  . Acute sinusitis 08/31/2019  . Abnormal stress test 06/18/2017  . 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 Gastro-esophageal reflux disease without esophagitis 06/11/2017  . Hypertension 06/11/2017  . Hyperlipidemia 02/08/2014    Current Outpatient Medications on File Prior to Visit  Medication Sig Dispense Refill  . ALPRAZolam (XANAX) 0.5 MG tablet Take 0.5 mg by mouth daily as needed.    [name] aripiprazole (ABILIFY) 10 MG disintegrating tablet Take 1 tablet (10 mg total) by mouth daily. 30 tablet 3  . diclofenac (VOLTAREN) 75 MG EC tablet Take 1 tablet (75 mg total) by mouth 2 (two) times daily. 30 tablet 0  . diclofenac Sodium (VOLTAREN) 1 % GEL Apply 4 g topically 4 (four) times daily. 150 g 0  . FLUoxetine (PROZAC) 20 MG capsule Take 1 capsule (20 mg total) by mouth daily. 90 capsule 1  . levothyroxine (SYNTHROID) 25 MCG tablet Take 1 tablet (25 mcg total) by mouth daily. 90 tablet 2  . lisinopril (ZESTRIL) 10 MG tablet Take 1 tablet (10 mg total) by mouth daily. 90 tablet 2  . montelukast (SINGULAIR) 10 MG tablet Take 1 tablet (10 mg total) by mouth daily. 90 tablet 1   No current facility-administered medications on file prior to visit.    Allergies  Allergen Reactions  . Augmentin [Amoxicillin-Pot Clavulanate]      Objective:  General: Alert and oriented x3 in no acute distress  Dermatology: Blanchable erythema to the posterior aspect of the left heel.  No open lesions bilateral lower extremities, no webspace macerations, no ecchymosis bilateral, all nails x 10 are well manicured.  Vascular: Dorsalis Pedis and Posterior Tibial pedal pulses 2/4, Capillary Fill Time 3 seconds, + pedal hair growth bilateral, no edema bilateral lower extremities, Temperature gradient within normal limits.  Neurology: Michaell Cowing sensation intact via light touch bilateral.  Musculoskeletal: Mild to moderate tenderness with palpation at insertion of the Achilles on left, there is mild calcaneal exostosis with mild soft tissue swelling present and decreased ankle rom with knee extending  vs flexed resembling gastroc equnius bilateral, The achilles tendon feels intact with no nodularity or palpable dell, Thompson sign negative unchanged from previous.  Assessment and Plan: Problem List Items Addressed This Visit   None   Visit Diagnoses    Achilles tendinitis of left lower extremity    -  Primary   Relevant Orders   MR ANKLE LEFT WO CONTRAST   Acquired equinus deformity of left foot       Pain of left heel       Relevant Orders   MR ANKLE LEFT WO CONTRAST      -Complete examination performed -Re-Discussed treatment options for continued insertional tendinitis with pain and exostosis -Ordered MRI to be performed at [facility] for further evaluation of  left heel advised patient if this MRI is negative we will proceed with sending him to physical therapy since pain is symptoms have been ongoing for 2 months we need to be very careful to make sure there is no underlying partial tearing happening -Advised patient to continue with cam boot and use of Achilles sleeve as provided again at this visit -Continue with diclofenac to take by mouth as instructed for pain and inflammation until he has finished the entire  prescription -Continue with diclofenac topical to use as instructed for localized pain and inflammation like previous -Advised gentle stretching and use of night splint with a combination of heat and ice -Continue with medical leave through 09/10/2020 -Patient to return to office after MRI or sooner if condition worsens.  Asencion Islam, DPM

## 2020-08-17 ENCOUNTER — Encounter: Payer: Self-pay | Admitting: Sports Medicine

## 2020-08-29 ENCOUNTER — Other Ambulatory Visit: Payer: Self-pay | Admitting: Legal Medicine

## 2020-08-31 ENCOUNTER — Other Ambulatory Visit: Payer: Self-pay | Admitting: Legal Medicine

## 2020-09-01 ENCOUNTER — Ambulatory Visit (INDEPENDENT_AMBULATORY_CARE_PROVIDER_SITE_OTHER): Payer: BC Managed Care – PPO | Admitting: Sports Medicine

## 2020-09-01 ENCOUNTER — Encounter: Payer: Self-pay | Admitting: Sports Medicine

## 2020-09-01 DIAGNOSIS — M7662 Achilles tendinitis, left leg: Secondary | ICD-10-CM

## 2020-09-01 DIAGNOSIS — M216X2 Other acquired deformities of left foot: Secondary | ICD-10-CM

## 2020-09-01 DIAGNOSIS — M79672 Pain in left foot: Secondary | ICD-10-CM

## 2020-09-01 NOTE — Progress Notes (Signed)
Virtual Visit via Telephone Note  I connected with Carlos Smith on 09/01/20 at  5:00 PM EDT by telephone and verified that I am speaking with the correct person using two identifiers.  Location: Patient: Carlos Smith (home)  Provider: Landis Martins, DPM ([facility] [place] office)    I discussed the limitations, risks, security and privacy concerns of performing an evaluation and management service by telephone and the availability of in person appointments. I also discussed with the patient that there may be a patient responsible charge related to this service. The patient expressed understanding and agreed to proceed.   History of Present Illness: Patient met over the phone to discuss MRI results patient has a history of pain at left Achilles.  Patient reports that he is doing good pain seems like it is slowly improving has no current complaints at this time.   Observations/Objective: Physical exam unable to be performed due to telephone nature of visit  Assessment and Plan: Problem List Items Addressed This Visit   None   Visit Diagnoses    Achilles tendinitis of left lower extremity    -  Primary   Acquired equinus deformity of left foot       Pain of left heel         Discussed MRI results with patient which is negative for any tendon tear but does suggest a very minimal amount of retrocalcaneal bursitis without any bony involvement the Achilles insertion Discussed with patient the need for physical therapy to help with bursitis and to help the tendon from over pulling in preparation to try to optimize patient to prepare him to go back to work; order placed for physical therapy at [employer] Meanwhile advised patient to continue with night splint and gentle stretching Continue with rest ice elevation as needed Continue with Achilles sleeve or heel lifts within the shoe to prevent rubbing and if pain worsens may return to using cam boot  Follow Up  Instructions: After physical therapy or sooner problems or issues arise   I discussed the assessment and treatment plan with the patient. The patient was provided an opportunity to ask questions and all were answered. The patient agreed with the plan and demonstrated an understanding of the instructions.   The patient was advised to call back or seek an in-person evaluation if the symptoms worsen or if the condition fails to improve as anticipated.  I provided  8 minutes of non-face-to-face time during this encounter.   Landis Martins, DPM

## 2020-09-05 ENCOUNTER — Other Ambulatory Visit: Payer: Self-pay

## 2020-09-05 ENCOUNTER — Encounter: Payer: Self-pay | Admitting: Legal Medicine

## 2020-09-05 ENCOUNTER — Telehealth (INDEPENDENT_AMBULATORY_CARE_PROVIDER_SITE_OTHER): Payer: BC Managed Care – PPO | Admitting: Legal Medicine

## 2020-09-05 ENCOUNTER — Telehealth: Payer: Self-pay | Admitting: Sports Medicine

## 2020-09-05 VITALS — Ht 70.0 in | Wt 166.0 lb

## 2020-09-05 DIAGNOSIS — J0100 Acute maxillary sinusitis, unspecified: Secondary | ICD-10-CM

## 2020-09-05 MED ORDER — PREDNISONE 10 MG PO TABS: ORAL_TABLET | ORAL | 0 refills | Status: AC

## 2020-09-05 MED ORDER — AZITHROMYCIN 250 MG PO TABS: ORAL_TABLET | ORAL | 0 refills | Status: AC

## 2020-09-05 NOTE — Telephone Encounter (Signed)
Pt req extension on fmla.  He is starting  physical therapy starting on 09-12-20 and we have him returning to work 09-10-20.

## 2020-09-05 NOTE — Progress Notes (Signed)
Virtual Visit via Video Note   This visit type was conducted due to national recommendations for restrictions regarding the COVID-19 Pandemic (e.g. social distancing) in an effort to limit this patient's exposure and mitigate transmission in our community.  Due to his co-morbid illnesses, this patient is at least at moderate risk for complications without adequate follow up.  This format is felt to be most appropriate for this patient at this time.  All issues noted in this document were discussed and addressed.  A limited physical exam was performed with this format.  A verbal consent was obtained for the virtual visit.   Date:  09/05/2020   ID:  Carlos Smith, DOB 1990/06/07, MRN [id number]  Patient Location: Home Provider Location: Office/Clinic  PCP:  Abigail Miyamoto, MD   Evaluation Performed:  New Patient Evaluation  Chief Complaint:  Patient having sinus congestion cough for 5 days.  No fever or chills  History of Present Illness:    Carlos Smith is a 30 y.o. male with Patient having sinus congestion cough for 5 days.  No fever or chills, cough non productive  The patient does not have symptoms concerning for COVID-19 infection (fever, chills, cough, or new shortness of breath).    Past Medical History:  Diagnosis Date  . Gastroesophageal reflux disease   . Hypertension   . Hypothyroidism     Past Surgical History:  Procedure Laterality Date  . NASAL SEPTUM SURGERY  05/2018    Family History  Problem Relation Age of Onset  . Hypertension Mother   . Hypertension Father   . Migraines Father   . Cerebrovascular Accident Father   . Stroke Father   . Hypertension Sister   . Hypertension Brother     Social History   Socioeconomic History  . Marital status: Married    Spouse name: Not on file  . Number of children: Not on file  . Years of education: Not on file  . Highest education level: Not on file  Occupational History  . Not on file  Tobacco Use   . Smoking status: Never Smoker  . Smokeless tobacco: Former Neurosurgeon    Types: Chew  Substance and Sexual Activity  . Alcohol use: Yes    Alcohol/week: 4.0 standard drinks    Types: 4 Cans of beer per week    Comment: twice a week  . Drug use: No  . Sexual activity: Yes    Partners: Female  Other Topics Concern  . Not on file  Social History Narrative  . Not on file   Social Determinants of Health   Financial Resource Strain: Not on file  Food Insecurity: Not on file  Transportation Needs: Not on file  Physical Activity: Not on file  Stress: Not on file  Social Connections: Not on file  Intimate Partner Violence: Not on file    Outpatient Medications Prior to Visit  Medication Sig Dispense Refill  . ALPRAZolam (XANAX) 0.5 MG tablet Take 0.5 mg by mouth daily as needed.    [name] aripiprazole (ABILIFY) 10 MG disintegrating tablet Take 1 tablet (10 mg total) by mouth daily. 30 tablet 3  . diclofenac (VOLTAREN) 75 MG EC tablet Take 1 tablet (75 mg total) by mouth 2 (two) times daily. 30 tablet 0  . diclofenac Sodium (VOLTAREN) 1 % GEL Apply 4 g topically 4 (four) times daily. 150 g 0  . FLUoxetine (PROZAC) 20 MG capsule Take 1 capsule (20 mg total) by mouth daily. 90 capsule  1  . levothyroxine (SYNTHROID) 25 MCG tablet TAKE 1 TABLET BY MOUTH ONCE DAILY 90 tablet 2  . lisinopril (ZESTRIL) 10 MG tablet TAKE ONE TABLET BY MOUTH DAILY 90 tablet 2  . montelukast (SINGULAIR) 10 MG tablet Take 1 tablet (10 mg total) by mouth daily. 90 tablet 1  . VRAYLAR 1.5 MG capsule Take 1.5 mg by mouth daily.     No facility-administered medications prior to visit.    Allergies:   Augmentin [amoxicillin-pot clavulanate]   Social History   Tobacco Use  . Smoking status: Never Smoker  . Smokeless tobacco: Former Neurosurgeon    Types: Chew  Substance Use Topics  . Alcohol use: Yes    Alcohol/week: 4.0 standard drinks    Types: 4 Cans of beer per week    Comment: twice a week  . Drug use: No      Review of Systems  Constitutional: Negative for chills and fever.  HENT: Positive for congestion and sinus pain.   Respiratory: Positive for cough. Negative for sputum production.   Cardiovascular: Negative for chest pain and claudication.  Gastrointestinal: Negative for melena.  Genitourinary: Negative for dysuria.  Musculoskeletal: Negative for myalgias.  Neurological: Negative for headaches.     Labs/Other Tests and Data Reviewed:    Recent Labs: 05/09/2020: ALT 28; BUN 7; Creatinine, Ser 0.86; Hemoglobin 16.1; Platelets 336; Potassium 4.5; Sodium 138   Recent Lipid Panel Lab Results  Component Value Date/Time   CHOL 163 06/18/2017 09:53 AM   TRIG 140 06/18/2017 09:53 AM   HDL 38 (L) 06/18/2017 09:53 AM   CHOLHDL 4.3 06/18/2017 09:53 AM   LDLCALC 97 06/18/2017 09:53 AM    Wt Readings from Last 3 Encounters:  09/05/20 166 lb (75.3 kg)  08/08/20 166 lb (75.3 kg)  07/31/20 171 lb (77.6 kg)     Objective:    Vital Signs:  Ht 5\' 10"  (1.778 m)   Wt 166 lb (75.3 kg)   BMI 23.82 kg/m    Physical Exam reviewed  ASSESSMENT & PLAN:   Diagnoses and all orders for this visit: Acute non-recurrent maxillary sinusitis -     azithromycin (ZITHROMAX) 250 MG tablet; Take 2 tablets on day 1, then 1 tablet daily on days 2 through 5 -     predniSONE (DELTASONE) 10 MG tablet; Take 6 tablets (60 mg total) by mouth daily with breakfast for 1 day, THEN 5 tablets (50 mg total) daily with breakfast for 1 day, THEN 4 tablets (40 mg total) daily with breakfast for 1 day, THEN 3 tablets (30 mg total) daily with breakfast for 1 day, THEN 2 tablets (20 mg total) daily with breakfast for 1 day, THEN 1 tablet (10 mg total) daily with breakfast for 1 day. Start z-pack and prednisone for sinusitis       COVID-19 Education: The signs and symptoms of COVID-19 were discussed with the patient and how to seek care for testing (follow up with PCP or arrange E-visit). The importance of social  distancing was discussed today.   I spent 20 minutes dedicated to the care of this patient on the date of this encounter to include face-to-face time with the patient, as well as:   Follow Up:  In Person prn  Signed, , MD  09/05/2020 10:25 AM    [facility] [place]

## 2020-12-08 ENCOUNTER — Other Ambulatory Visit: Payer: Self-pay | Admitting: Legal Medicine

## 2021-01-29 DIAGNOSIS — E039 Hypothyroidism, unspecified: Secondary | ICD-10-CM | POA: Diagnosis not present

## 2021-01-29 DIAGNOSIS — E538 Deficiency of other specified B group vitamins: Secondary | ICD-10-CM | POA: Diagnosis not present

## 2021-01-29 DIAGNOSIS — K900 Celiac disease: Secondary | ICD-10-CM | POA: Diagnosis not present

## 2021-01-29 DIAGNOSIS — F319 Bipolar disorder, unspecified: Secondary | ICD-10-CM | POA: Diagnosis not present

## 2021-01-29 DIAGNOSIS — R5383 Other fatigue: Secondary | ICD-10-CM | POA: Diagnosis not present

## 2021-02-13 DIAGNOSIS — I10 Essential (primary) hypertension: Secondary | ICD-10-CM | POA: Diagnosis not present

## 2021-02-13 DIAGNOSIS — K900 Celiac disease: Secondary | ICD-10-CM | POA: Diagnosis not present

## 2021-02-13 DIAGNOSIS — R7989 Other specified abnormal findings of blood chemistry: Secondary | ICD-10-CM | POA: Diagnosis not present

## 2021-02-13 DIAGNOSIS — R5383 Other fatigue: Secondary | ICD-10-CM | POA: Diagnosis not present

## 2021-02-27 ENCOUNTER — Encounter: Payer: Self-pay | Admitting: Nurse Practitioner

## 2021-02-27 ENCOUNTER — Ambulatory Visit: Payer: BC Managed Care – PPO | Admitting: Physician Assistant

## 2021-02-27 ENCOUNTER — Ambulatory Visit (INDEPENDENT_AMBULATORY_CARE_PROVIDER_SITE_OTHER): Payer: BC Managed Care – PPO | Admitting: Nurse Practitioner

## 2021-02-27 VITALS — BP 118/68 | HR 96 | Temp 97.4°F | Ht 70.0 in | Wt 175.0 lb

## 2021-02-27 DIAGNOSIS — R0981 Nasal congestion: Secondary | ICD-10-CM

## 2021-02-27 DIAGNOSIS — J101 Influenza due to other identified influenza virus with other respiratory manifestations: Secondary | ICD-10-CM | POA: Diagnosis not present

## 2021-02-27 LAB — POCT INFLUENZA A/B
Influenza A, POC: POSITIVE — AB
Influenza B, POC: NEGATIVE

## 2021-02-27 LAB — POC COVID19 BINAXNOW: SARS Coronavirus 2 Ag: NEGATIVE

## 2021-02-27 MED ORDER — OSELTAMIVIR PHOSPHATE 75 MG PO CAPS: 75.0000 mg | ORAL_CAPSULE | Freq: Two times a day (BID) | ORAL | 0 refills | Status: DC

## 2021-02-27 MED ORDER — XOFLUZA (80 MG DOSE) 1 X 80 MG PO TBPK: 80.0000 mg | ORAL_TABLET | Freq: Once | ORAL | 0 refills | Status: AC

## 2021-02-27 NOTE — Patient Instructions (Addendum)
Take Tamiflu or Xofluza as directed Rest and push fluids Follow-up as needed  Influenza, Adult Influenza, also called "the flu," is a viral infection that mainly affects the respiratory tract. This includes the lungs, nose, and throat. The flu spreads easily from person to person (is contagious). It causes common cold symptoms, along with high fever and body aches. What are the causes? This condition is caused by the influenza virus. You can get the virus by: Breathing in droplets that are in the air from an infected person's cough or sneeze. Touching something that has the virus on it (has been contaminated) and then touching your mouth, nose, or eyes. What increases the risk? The following factors may make you more likely to get the flu: Not washing or sanitizing your hands often. Having close contact with many people during cold and flu season. Touching your mouth, eyes, or nose without first washing or sanitizing your hands. Not getting an annual flu shot. You may have a higher risk for the flu, including serious problems, such as a lung infection (pneumonia), if you: Are older than 65. Are pregnant. Have a weakened disease-fighting system (immune system). This includes people who have HIV or AIDS, are on chemotherapy, or are taking medicines that reduce (suppress) the immune system. Have a long-term (chronic) illness, such as heart disease, kidney disease, diabetes, or lung disease. Have a liver disorder. Are severely overweight (morbidly obese). Have anemia. Have asthma. What are the signs or symptoms? Symptoms of this condition usually begin suddenly and last 4-14 days. These may include: Fever and chills. Headaches, body aches, or muscle aches. Sore throat. Cough. Runny or stuffy (congested) nose. Chest discomfort. Poor appetite. Weakness or fatigue. Dizziness. Nausea or vomiting. How is this diagnosed? This condition may be diagnosed based on: Your symptoms and medical  history. A physical exam. Swabbing your nose or throat and testing the fluid for the influenza virus. How is this treated? If the flu is diagnosed early, you can be treated with antiviral medicine that is given by mouth (orally) or through an IV. This can help reduce how severe the illness is and how long it lasts. Taking care of yourself at home can help relieve symptoms. Your health care provider may recommend: Taking over-the-counter medicines. Drinking plenty of fluids. In many cases, the flu goes away on its own. If you have severe symptoms or complications, you may be treated in a hospital. Follow these instructions at home: Activity Rest as needed and get plenty of sleep. Stay home from work or school as told by your health care provider. Unless you are visiting your health care provider, avoid leaving home until your fever has been gone for 24 hours without taking medicine. Eating and drinking Take an oral rehydration solution (ORS). This is a drink that is sold at pharmacies and retail stores. Drink enough fluid to keep your urine pale yellow. Drink clear fluids in small amounts as you are able. Clear fluids include water, ice chips, fruit juice mixed with water, and low-calorie sports drinks. Eat bland, easy-to-digest foods in small amounts as you are able. These foods include bananas, applesauce, rice, lean meats, toast, and crackers. Avoid drinking fluids that contain a lot of sugar or caffeine, such as energy drinks, regular sports drinks, and soda. Avoid alcohol. Avoid spicy or fatty foods. General instructions   Take over-the-counter and prescription medicines only as told by your health care provider. Use a cool mist humidifier to add humidity to the air in your  home. This can make it easier to breathe. When using a cool mist humidifier, clean it daily. Empty the water and replace it with clean water. Cover your mouth and nose when you cough or sneeze. Wash your hands with  soap and water often and for at least 20 seconds, especially after you cough or sneeze. If soap and water are not available, use alcohol-based hand sanitizer. Keep all follow-up visits. This is important. How is this prevented?  Get an annual flu shot. This is usually available in late summer, fall, or winter. Ask your health care provider when you should get your flu shot. Avoid contact with people who are sick during cold and flu season. This is generally fall and winter. Contact a health care provider if: You develop new symptoms. You have: Chest pain. Diarrhea. A fever. Your cough gets worse. You produce more mucus. You feel nauseous or you vomit. Get help right away if you: Develop shortness of breath or have difficulty breathing. Have skin or nails that turn a bluish color. Have severe pain or stiffness in your neck. Develop a sudden headache or sudden pain in your face or ear. Cannot eat or drink without vomiting. These symptoms may represent a serious problem that is an emergency. Do not wait to see if the symptoms will go away. Get medical help right away. Call your local emergency services (911 in the [place] Do not drive yourself to the hospital. Summary Influenza, also called "the flu," is a viral infection that primarily affects your respiratory tract. Symptoms of the flu usually begin suddenly and last 4-14 days. Getting an annual flu shot is the best way to prevent getting the flu. Stay home from work or school as told by your health care provider. Unless you are visiting your health care provider, avoid leaving home until your fever has been gone for 24 hours without taking medicine. Keep all follow-up visits. This is important. This information is not intended to replace advice given to you by your health care provider. Make sure you discuss any questions you have with your health care provider. Document Revised: 11/19/2019 Document Reviewed: 11/19/2019 Elsevier Patient  Education  [place].

## 2021-02-27 NOTE — Progress Notes (Signed)
Acute Office Visit  Subjective:    Patient ID: Carlos Smith, male    DOB: [birth year], 30 y.o.   MRN: [id number]  CC: Sinus congestion  HPI: Patient is in today for Upper respiratory symptoms He complains of congestion, non productive cough, and post nasal drip.with chills. Onset of symptoms was  2 days ago and worsening.He is not drinking much.  Past history is significant for no history of pneumonia or bronchitis. Patient is non-smoker. He has a past history of chronic allergic rhinitis, underwent sinus surgery approximately 2 years ago. States his pre-school age  was recently diagnosed with influenza.  Past Medical History:  Diagnosis Date   Gastroesophageal reflux disease    Hypertension    Hypothyroidism     Past Surgical History:  Procedure Laterality Date   NASAL SEPTUM SURGERY  05/2018    Family History  Problem Relation Age of Onset   Hypertension Mother    Hypertension Father    Migraines Father    Cerebrovascular Accident Father    Stroke Father    Hypertension Sister    Hypertension Brother     Social History   Socioeconomic History   Marital status: Married    Spouse name: Not on file   Number of children: Not on file   Years of education: Not on file   Highest education level: Not on file  Occupational History   Not on file  Tobacco Use   Smoking status: Never   Smokeless tobacco: Former    Types: Chew  Substance and Sexual Activity   Alcohol use: Yes    Alcohol/week: 4.0 standard drinks    Types: 4 Cans of beer per week    Comment: twice a week   Drug use: No   Sexual activity: Yes    Partners: Female  Other Topics Concern   Not on file  Social History Narrative   Not on file   Social Determinants of Health   Financial Resource Strain: Not on file  Food Insecurity: Not on file  Transportation Needs: Not on file  Physical Activity: Not on file  Stress: Not on file  Social Connections: Not on file  Intimate Partner Violence: Not on  file    Outpatient Medications Prior to Visit  Medication Sig Dispense Refill   ALPRAZolam (XANAX) 0.5 MG tablet Take 0.5 mg by mouth daily as needed.     aripiprazole (ABILIFY) 10 MG disintegrating tablet Take 1 tablet (10 mg total) by mouth daily. 30 tablet 3   diclofenac (VOLTAREN) 75 MG EC tablet Take 1 tablet (75 mg total) by mouth 2 (two) times daily. 30 tablet 0   diclofenac Sodium (VOLTAREN) 1 % GEL Apply 4 g topically 4 (four) times daily. 150 g 0   FLUoxetine (PROZAC) 20 MG capsule Take 1 capsule (20 mg total) by mouth daily. 90 capsule 1   levothyroxine (SYNTHROID) 25 MCG tablet TAKE 1 TABLET BY MOUTH ONCE DAILY 90 tablet 2   lisinopril (ZESTRIL) 10 MG tablet TAKE ONE TABLET BY MOUTH DAILY 90 tablet 2   montelukast (SINGULAIR) 10 MG tablet TAKE ONE TABLET BY MOUTH EVERY DAY 90 tablet 1   VRAYLAR 1.5 MG capsule Take 1.5 mg by mouth daily.     No facility-administered medications prior to visit.    Allergies  Allergen Reactions   Augmentin [Amoxicillin-Pot Clavulanate]     Review of Systems  Constitutional:  Positive for chills and fatigue. Negative for fever.  HENT:  Positive for congestion,  postnasal drip and sore throat.   Respiratory:  Negative for cough and shortness of breath.   Cardiovascular:  Negative for chest pain.  Gastrointestinal:  Negative for diarrhea and nausea.  Musculoskeletal:        Body aches  Neurological:  Positive for headaches.      Objective:    BP 118/68   Pulse 96   Temp (!) 97.4 F (36.3 C)   Ht 5\' 10"  (1.778 m)   Wt 175 lb (79.4 kg)   SpO2 100%   BMI 25.11 kg/m     Physical Exam Vitals reviewed.  Constitutional:      Appearance: He is ill-appearing.  HENT:     Right Ear: Tympanic membrane normal.     Left Ear: Tympanic membrane normal.     Nose: Congestion and rhinorrhea present.     Mouth/Throat:     Pharynx: Posterior oropharyngeal erythema present.  Cardiovascular:     Rate and Rhythm: Normal rate and regular rhythm.      Pulses: Normal pulses.     Heart sounds: Normal heart sounds.  Pulmonary:     Effort: Pulmonary effort is normal.     Breath sounds: Normal breath sounds.  Abdominal:     General: Bowel sounds are normal.     Palpations: Abdomen is soft.  Skin:    General: Skin is warm and dry.     Capillary Refill: Capillary refill takes less than 2 seconds.  Neurological:     General: No focal deficit present.     Mental Status: He is alert.     Wt Readings from Last 3 Encounters:  09/05/20 166 lb (75.3 kg)  08/08/20 166 lb (75.3 kg)  07/31/20 171 lb (77.6 kg)    Health Maintenance Due  Topic Date Due   Hepatitis C Screening  Never done   INFLUENZA VACCINE  Never done       Lab Results  Component Value Date   TSH 1.560 06/18/2017   Lab Results  Component Value Date   WBC 9.1 05/09/2020   HGB 16.1 05/09/2020   HCT 45.0 05/09/2020   MCV 87 05/09/2020   PLT 336 05/09/2020   Lab Results  Component Value Date   NA 138 05/09/2020   K 4.5 05/09/2020   CO2 23 05/09/2020   GLUCOSE 88 05/09/2020   BUN 7 05/09/2020   CREATININE 0.86 05/09/2020   BILITOT 0.4 05/09/2020   ALKPHOS 99 05/09/2020   AST 19 05/09/2020   ALT 28 05/09/2020   PROT 7.4 05/09/2020   ALBUMIN 5.1 05/09/2020   CALCIUM 9.8 05/09/2020   Lab Results  Component Value Date   CHOL 163 06/18/2017   Lab Results  Component Value Date   HDL 38 (L) 06/18/2017   Lab Results  Component Value Date   LDLCALC 97 06/18/2017   Lab Results  Component Value Date   TRIG 140 06/18/2017   Lab Results  Component Value Date   CHOLHDL 4.3 06/18/2017        Assessment & Plan:   1. Influenza A - POC COVID-19 BinaxNow-NEGATIVE - POCT Influenza A/B-INFLUENZA A POSITIVE, INFLUENZA B NEGATIVE - Baloxavir Marboxil,80 MG Dose, (XOFLUZA, 80 MG DOSE,) 1 x 80 MG TBPK; Take 80 mg by mouth once for 1 dose.  Dispense: 1 each; Refill: 0 - oseltamivir (TAMIFLU) 75 MG capsule; Take 1 capsule (75 mg total) by mouth 2 (two)  times daily.  Dispense: 10 capsule; Refill: 0  2. Sinus congestion -  POC COVID-19 BinaxNow-NEGATIVE   Take Tamiflu or Xofluza as directed Rest and push fluids Follow-up as needed     Follow-up: PRN  An After Visit Summary was printed and given to the patient.  I, Janie Morning, NP, have reviewed all documentation for this visit. The documentation on 02/27/21 for the exam, diagnosis, procedures, and orders are all accurate and complete.    Signed, Janie Morning, NP [facility] [phone]

## 2021-04-30 DIAGNOSIS — R5383 Other fatigue: Secondary | ICD-10-CM | POA: Diagnosis not present

## 2021-04-30 DIAGNOSIS — R7989 Other specified abnormal findings of blood chemistry: Secondary | ICD-10-CM | POA: Diagnosis not present

## 2021-04-30 DIAGNOSIS — K900 Celiac disease: Secondary | ICD-10-CM | POA: Diagnosis not present

## 2021-04-30 DIAGNOSIS — I10 Essential (primary) hypertension: Secondary | ICD-10-CM | POA: Diagnosis not present

## 2021-05-15 DIAGNOSIS — K900 Celiac disease: Secondary | ICD-10-CM | POA: Diagnosis not present

## 2021-05-15 DIAGNOSIS — R7989 Other specified abnormal findings of blood chemistry: Secondary | ICD-10-CM | POA: Diagnosis not present

## 2021-05-15 DIAGNOSIS — I10 Essential (primary) hypertension: Secondary | ICD-10-CM | POA: Diagnosis not present

## 2021-05-15 DIAGNOSIS — R5383 Other fatigue: Secondary | ICD-10-CM | POA: Diagnosis not present

## 2021-05-30 ENCOUNTER — Encounter: Payer: Self-pay | Admitting: Legal Medicine

## 2021-05-30 ENCOUNTER — Telehealth (INDEPENDENT_AMBULATORY_CARE_PROVIDER_SITE_OTHER): Payer: BC Managed Care – PPO | Admitting: Legal Medicine

## 2021-05-30 VITALS — Ht 70.0 in | Wt 175.0 lb

## 2021-05-30 DIAGNOSIS — G4489 Other headache syndrome: Secondary | ICD-10-CM | POA: Diagnosis not present

## 2021-05-30 DIAGNOSIS — U071 COVID-19: Secondary | ICD-10-CM | POA: Insufficient documentation

## 2021-05-30 LAB — POC COVID19 BINAXNOW: SARS Coronavirus 2 Ag: POSITIVE — AB

## 2021-05-30 MED ORDER — NIRMATRELVIR/RITONAVIR (PAXLOVID)TABLET
3.0000 | ORAL_TABLET | Freq: Two times a day (BID) | ORAL | 0 refills | Status: AC
Start: 2021-05-30 — End: 2021-06-04

## 2021-05-30 NOTE — Progress Notes (Signed)
Virtual Visit via Video Note   This visit type was conducted due to national recommendations for restrictions regarding the COVID-19 Pandemic (e.g. social distancing) in an effort to limit this patient's exposure and mitigate transmission in our community.  Due to his co-morbid illnesses, this patient is at least at moderate risk for complications without adequate follow up.  This format is felt to be most appropriate for this patient at this time.  All issues noted in this document were discussed and addressed.  A limited physical exam was performed with this format.  A verbal consent was obtained for the virtual visit.   Date:  05/30/2021   ID:  Carlos Smith, DOB 08-09-1990, MRN [id number]  Patient Location: Home Provider Location: Office/Clinic  PCP:  Abigail Miyamoto, MD   Evaluation Performed:  New Patient Evaluation  Chief Complaint:  positive Covid  History of Present Illness:    Carlos Smith is a 31 y.o. male with positive Covid got sick 2 days ago, fever, myalgias, headache,   The patient does have symptoms concerning for COVID-19 infection (fever, chills, cough, or new shortness of breath).    Past Medical History:  Diagnosis Date   Gastroesophageal reflux disease    Hypertension    Hypothyroidism     Past Surgical History:  Procedure Laterality Date   NASAL SEPTUM SURGERY  05/2018    Family History  Problem Relation Age of Onset   Hypertension Mother    Hypertension Father    Migraines Father    Cerebrovascular Accident Father    Stroke Father    Hypertension Sister    Hypertension Brother     Social History   Socioeconomic History   Marital status: Married    Spouse name: Not on file   Number of children: Not on file   Years of education: Not on file   Highest education level: Not on file  Occupational History   Not on file  Tobacco Use   Smoking status: Never   Smokeless tobacco: Former    Types: Chew  Substance and Sexual Activity    Alcohol use: Yes    Alcohol/week: 4.0 standard drinks    Types: 4 Cans of beer per week    Comment: twice a week   Drug use: No   Sexual activity: Yes    Partners: Female  Other Topics Concern   Not on file  Social History Narrative   Not on file   Social Determinants of Health   Financial Resource Strain: Not on file  Food Insecurity: Not on file  Transportation Needs: Not on file  Physical Activity: Not on file  Stress: Not on file  Social Connections: Not on file  Intimate Partner Violence: Not on file    Outpatient Medications Prior to Visit  Medication Sig Dispense Refill   levothyroxine (SYNTHROID) 25 MCG tablet TAKE 1 TABLET BY MOUTH ONCE DAILY 90 tablet 2   lisinopril (ZESTRIL) 10 MG tablet TAKE ONE TABLET BY MOUTH DAILY 90 tablet 2   montelukast (SINGULAIR) 10 MG tablet TAKE ONE TABLET BY MOUTH EVERY DAY 90 tablet 1   oseltamivir (TAMIFLU) 75 MG capsule Take 1 capsule (75 mg total) by mouth 2 (two) times daily. 10 capsule 0   Testosterone Cypionate 200 MG/ML SOLN Inject 200 mg as directed once a week.     No facility-administered medications prior to visit.    Allergies:   Augmentin [amoxicillin-pot clavulanate]   Social History   Tobacco Use  Smoking status: Never   Smokeless tobacco: Former    Types: Chew  Substance Use Topics   Alcohol use: Yes    Alcohol/week: 4.0 standard drinks    Types: 4 Cans of beer per week    Comment: twice a week   Drug use: No     Review of Systems  Constitutional:  Positive for chills, fever and malaise/fatigue.  HENT:  Positive for congestion, ear pain and sinus pain. Negative for sore throat.   Eyes:  Negative for redness.  Respiratory:  Negative for cough and shortness of breath.   Cardiovascular:  Negative for chest pain.  Musculoskeletal:  Positive for myalgias.  Neurological:  Positive for headaches.    Labs/Other Tests and Data Reviewed:    Recent Labs: No results found for requested labs within last 8760  hours.   Recent Lipid Panel Lab Results  Component Value Date/Time   CHOL 163 06/18/2017 09:53 AM   TRIG 140 06/18/2017 09:53 AM   HDL 38 (L) 06/18/2017 09:53 AM   CHOLHDL 4.3 06/18/2017 09:53 AM   LDLCALC 97 06/18/2017 09:53 AM    Wt Readings from Last 3 Encounters:  05/30/21 175 lb (79.4 kg)  02/27/21 175 lb (79.4 kg)  09/05/20 166 lb (75.3 kg)     Objective:    Vital Signs:  Ht 5\' 10"  (1.778 m)    Wt 175 lb (79.4 kg)    BMI 25.11 kg/m    Physical Exam reviewed  ASSESSMENT & PLAN:    Problem List Items Addressed This Visit       Other   COVID   Relevant Medications   nirmatrelvir/ritonavir EUA (PAXLOVID) 20 x 150 MG & 10 x 100MG  TABS Patient has covid, treat with paxlovid, wear mask for 10 days   Other Visit Diagnoses     Other headache syndrome    -  Primary   Relevant Orders   POC COVID-19 (Completed)- positive     .  Orders Placed This Encounter  Procedures   POC COVID-19     Meds ordered this encounter  Medications   nirmatrelvir/ritonavir EUA (PAXLOVID) 20 x 150 MG & 10 x 100MG  TABS    Sig: Take 3 tablets by mouth 2 (two) times daily for 5 days. (Take nirmatrelvir 150 mg two tablets twice daily for 5 days and ritonavir 100 mg one tablet twice daily for 5 days) Patient GFR is 135    Dispense:  30 tablet    Refill:  0    COVID-19 Education: The signs and symptoms of COVID-19 were discussed with the patient and how to seek care for testing (follow up with PCP or arrange E-visit). The importance of social distancing was discussed today.   I spent 20 minutes dedicated to the care of this patient on the date of this encounter to include face-to-face time with the patient, as well as:   Follow Up:  In Person prn  Signed, , MD  05/30/2021 9:59 AM    [facility]

## 2021-06-13 ENCOUNTER — Other Ambulatory Visit: Payer: Self-pay | Admitting: Legal Medicine

## 2021-06-25 ENCOUNTER — Encounter: Payer: Self-pay | Admitting: Legal Medicine

## 2021-06-25 ENCOUNTER — Ambulatory Visit (INDEPENDENT_AMBULATORY_CARE_PROVIDER_SITE_OTHER): Payer: BC Managed Care – PPO | Admitting: Legal Medicine

## 2021-06-25 VITALS — BP 106/70 | HR 75 | Temp 98.6°F | Resp 15 | Ht 70.0 in | Wt 174.0 lb

## 2021-06-25 DIAGNOSIS — J029 Acute pharyngitis, unspecified: Secondary | ICD-10-CM

## 2021-06-25 DIAGNOSIS — J0100 Acute maxillary sinusitis, unspecified: Secondary | ICD-10-CM

## 2021-06-25 LAB — POC COVID19 BINAXNOW: SARS Coronavirus 2 Ag: NEGATIVE

## 2021-06-25 MED ORDER — AZITHROMYCIN 250 MG PO TABS: ORAL_TABLET | ORAL | 0 refills | Status: AC

## 2021-06-25 MED ORDER — TRIAMCINOLONE ACETONIDE 40 MG/ML IJ SUSP
80.0000 mg | Freq: Once | INTRAMUSCULAR | Status: AC
  Administered 2021-06-25: 80 mg via INTRAMUSCULAR

## 2021-06-25 NOTE — Progress Notes (Signed)
? ?Acute Office Visit ? ?Subjective:  ? ? Patient ID: Carlos Smith, male    DOB: May 14, 1990, 31 y.o.   MRN: [id number] ? ?Chief Complaint  ?Patient presents with  ? Sinusitis  ? Sore Throat  ? ? ?HPI: ?Patient is in today for sinus pressure, sore throat, fatigue, nasal congestion since 3 days ago. He denied fever, chills, headache, cough, chest pain and sob. He had covid 2 weeks ago. ? ?Past Medical History:  ?Diagnosis Date  ? Gastroesophageal reflux disease   ? Hypertension   ? Hypothyroidism   ? ? ?Past Surgical History:  ?Procedure Laterality Date  ? NASAL SEPTUM SURGERY  05/2018  ? ? ?Family History  ?Problem Relation Age of Onset  ? Hypertension Mother   ? Hypertension Father   ? Migraines Father   ? Cerebrovascular Accident Father   ? Stroke Father   ? Hypertension Sister   ? Hypertension Brother   ? ? ?Social History  ? ?Socioeconomic History  ? Marital status: Married  ?  Spouse name: Not on file  ? Number of children: Not on file  ? Years of education: Not on file  ? Highest education level: Not on file  ?Occupational History  ? Not on file  ?Tobacco Use  ? Smoking status: Never  ? Smokeless tobacco: Former  ?  Types: Chew  ?Substance and Sexual Activity  ? Alcohol use: Yes  ?  Alcohol/week: 4.0 standard drinks  ?  Types: 4 Cans of beer per week  ?  Comment: twice a week  ? Drug use: No  ? Sexual activity: Yes  ?  Partners: Female  ?Other Topics Concern  ? Not on file  ?Social History Narrative  ? Not on file  ? ?Social Determinants of Health  ? ?Financial Resource Strain: Not on file  ?Food Insecurity: Not on file  ?Transportation Needs: Not on file  ?Physical Activity: Not on file  ?Stress: Not on file  ?Social Connections: Not on file  ?Intimate Partner Violence: Not on file  ? ? ?Outpatient Medications Prior to Visit  ?Medication Sig Dispense Refill  ? levothyroxine (SYNTHROID) 25 MCG tablet TAKE 1 TABLET BY MOUTH ONCE DAILY 90 tablet 2  ? lisinopril (ZESTRIL) 10 MG tablet TAKE ONE TABLET BY MOUTH DAILY  90 tablet 2  ? montelukast (SINGULAIR) 10 MG tablet TAKE ONE TABLET BY MOUTH EVERY DAY 90 tablet 1  ? oseltamivir (TAMIFLU) 75 MG capsule Take 1 capsule (75 mg total) by mouth 2 (two) times daily. 10 capsule 0  ? Testosterone Cypionate 200 MG/ML SOLN Inject 200 mg as directed once a week.    ? ?No facility-administered medications prior to visit.  ? ? ?Allergies  ?Allergen Reactions  ? Augmentin [Amoxicillin-Pot Clavulanate]   ? ? ?Review of Systems  ?Constitutional:  Positive for fatigue. Negative for chills, fever and unexpected weight change.  ?HENT:  Positive for congestion, postnasal drip, rhinorrhea, sinus pressure, sinus pain and sore throat. Negative for ear pain.   ?Respiratory:  Negative for cough and shortness of breath.   ?Cardiovascular:  Negative for chest pain and palpitations.  ?Gastrointestinal:  Positive for diarrhea. Negative for abdominal pain, blood in stool, constipation, nausea and vomiting.  ?Endocrine: Negative for polydipsia.  ?Genitourinary:  Negative for dysuria.  ?Musculoskeletal:  Negative for back pain.  ?Skin:  Negative for rash.  ?Neurological:  Negative for headaches.  ? ?   ?Objective:  ?  ?Physical Exam ?Vitals reviewed.  ?Constitutional:   ?  General: He is in acute distress.  ?   Appearance: He is well-developed.  ?HENT:  ?   Right Ear: Tympanic membrane normal.  ?   Left Ear: Tympanic membrane normal.  ?Eyes:  ?   Conjunctiva/sclera: Conjunctivae normal.  ?   Pupils: Pupils are equal, round, and reactive to light.  ?Cardiovascular:  ?   Rate and Rhythm: Regular rhythm.  ?Musculoskeletal:  ?   Cervical back: Neck supple.  ?Lymphadenopathy:  ?   Cervical: No cervical adenopathy.  ?Neurological:  ?   Mental Status: He is alert.  ? ? ?BP 106/70   Pulse 75   Temp 98.6 ?F (37 ?C)   Resp 15   Ht 5\' 10"  (1.778 m)   Wt 174 lb (78.9 kg)   SpO2 98%   BMI 24.97 kg/m?  ?Wt Readings from Last 3 Encounters:  ?06/25/21 174 lb (78.9 kg)  ?05/30/21 175 lb (79.4 kg)  ?02/27/21 175 lb  (79.4 kg)  ? ? ?Health Maintenance Due  ?Topic Date Due  ? Hepatitis C Screening  Never done  ? TETANUS/TDAP  Never done  ? ? ?There are no preventive care reminders to display for this patient. ? ? ?Lab Results  ?Component Value Date  ? TSH 1.560 06/18/2017  ? ?Lab Results  ?Component Value Date  ? WBC 9.1 05/09/2020  ? HGB 16.1 05/09/2020  ? HCT 45.0 05/09/2020  ? MCV 87 05/09/2020  ? PLT 336 05/09/2020  ? ?Lab Results  ?Component Value Date  ? NA 138 05/09/2020  ? K 4.5 05/09/2020  ? CO2 23 05/09/2020  ? GLUCOSE 88 05/09/2020  ? BUN 7 05/09/2020  ? CREATININE 0.86 05/09/2020  ? BILITOT 0.4 05/09/2020  ? ALKPHOS 99 05/09/2020  ? AST 19 05/09/2020  ? ALT 28 05/09/2020  ? PROT 7.4 05/09/2020  ? ALBUMIN 5.1 05/09/2020  ? CALCIUM 9.8 05/09/2020  ? ?Lab Results  ?Component Value Date  ? CHOL 163 06/18/2017  ? ?Lab Results  ?Component Value Date  ? HDL 38 (L) 06/18/2017  ? ?Lab Results  ?Component Value Date  ? [place] 97 06/18/2017  ? ?Lab Results  ?Component Value Date  ? TRIG 140 06/18/2017  ? ?Lab Results  ?Component Value Date  ? CHOLHDL 4.3 06/18/2017  ? ?No results found for: HGBA1C ? ?   ?Assessment & Plan:  ? ?Problem List Items Addressed This Visit   ?None ?Visit Diagnoses   ? ? Sore throat    -  Primary  ? Relevant Orders  ? POC COVID-19 (Completed)- negative  ? Acute non-recurrent maxillary sinusitis      ? Relevant Medications  ? triamcinolone acetonide (KENALOG-40) injection 80 mg (Completed)  ? azithromycin (ZITHROMAX) 250 MG tablet ?Patient has sinusitis, treat with kenalog and z-pack  ? ?  ? ?Meds ordered this encounter  ?Medications  ? triamcinolone acetonide (KENALOG-40) injection 80 mg  ? azithromycin (ZITHROMAX) 250 MG tablet  ?  Sig: Take 2 tablets on day 1, then 1 tablet daily on days 2 through 5  ?  Dispense:  6 tablet  ?  Refill:  0  ? ? ?Orders Placed This Encounter  ?Procedures  ? [place] COVID-19  ?  ? ?Follow-up: Return if symptoms worsen or fail to improve. ? ?An After Visit Summary was printed  and given to the patient. ? ?Reinaldo Meeker, MD ?[place] ?[phone]

## 2021-07-09 DIAGNOSIS — I10 Essential (primary) hypertension: Secondary | ICD-10-CM | POA: Diagnosis not present

## 2021-07-09 DIAGNOSIS — F411 Generalized anxiety disorder: Secondary | ICD-10-CM | POA: Diagnosis not present

## 2021-07-09 DIAGNOSIS — E034 Atrophy of thyroid (acquired): Secondary | ICD-10-CM | POA: Diagnosis not present

## 2021-07-09 DIAGNOSIS — E291 Testicular hypofunction: Secondary | ICD-10-CM | POA: Diagnosis not present

## 2021-08-10 DIAGNOSIS — E034 Atrophy of thyroid (acquired): Secondary | ICD-10-CM | POA: Diagnosis not present

## 2021-08-10 DIAGNOSIS — R059 Cough, unspecified: Secondary | ICD-10-CM | POA: Diagnosis not present

## 2021-08-10 DIAGNOSIS — I10 Essential (primary) hypertension: Secondary | ICD-10-CM | POA: Diagnosis not present

## 2021-08-10 DIAGNOSIS — F411 Generalized anxiety disorder: Secondary | ICD-10-CM | POA: Diagnosis not present

## 2021-10-15 ENCOUNTER — Other Ambulatory Visit: Payer: Self-pay

## 2021-10-15 MED ORDER — MONTELUKAST SODIUM 10 MG PO TABS: 10.0000 mg | ORAL_TABLET | Freq: Every day | ORAL | 0 refills | Status: DC

## 2021-11-02 DIAGNOSIS — R7989 Other specified abnormal findings of blood chemistry: Secondary | ICD-10-CM | POA: Diagnosis not present

## 2021-11-02 DIAGNOSIS — E039 Hypothyroidism, unspecified: Secondary | ICD-10-CM | POA: Diagnosis not present

## 2021-11-02 DIAGNOSIS — R5383 Other fatigue: Secondary | ICD-10-CM | POA: Diagnosis not present

## 2021-11-08 DIAGNOSIS — R5383 Other fatigue: Secondary | ICD-10-CM | POA: Diagnosis not present

## 2021-11-08 DIAGNOSIS — K900 Celiac disease: Secondary | ICD-10-CM | POA: Diagnosis not present

## 2021-11-08 DIAGNOSIS — I10 Essential (primary) hypertension: Secondary | ICD-10-CM | POA: Diagnosis not present

## 2021-11-08 DIAGNOSIS — R7989 Other specified abnormal findings of blood chemistry: Secondary | ICD-10-CM | POA: Diagnosis not present

## 2021-12-11 ENCOUNTER — Telehealth: Payer: Self-pay

## 2021-12-11 DIAGNOSIS — Z6827 Body mass index (BMI) 27.0-27.9, adult: Secondary | ICD-10-CM | POA: Diagnosis not present

## 2021-12-11 DIAGNOSIS — J0100 Acute maxillary sinusitis, unspecified: Secondary | ICD-10-CM | POA: Diagnosis not present

## 2021-12-11 NOTE — Telephone Encounter (Signed)
Patient called to schedule appointment. Spoke with misty; complaints of fatigue, sinus drainage, congestion, headache. Has not had fever since Sunday. Symptoms began Friday.   Misty spoke with Dr Sedalia Muta who recommended patient go to urgent care or do virtual visit with [facility].   I called patient back to make patient aware of recommendations. Helped patient reach [facility] site for virtual appointment.   Lorita Officer, CCMA 12/11/21 9:00 AM

## 2022-01-14 DIAGNOSIS — K529 Noninfective gastroenteritis and colitis, unspecified: Secondary | ICD-10-CM | POA: Diagnosis not present

## 2022-01-14 DIAGNOSIS — B349 Viral infection, unspecified: Secondary | ICD-10-CM | POA: Diagnosis not present

## 2022-01-14 DIAGNOSIS — Z6827 Body mass index (BMI) 27.0-27.9, adult: Secondary | ICD-10-CM | POA: Diagnosis not present

## 2022-01-17 ENCOUNTER — Other Ambulatory Visit: Payer: Self-pay | Admitting: Family Medicine

## 2022-02-22 DIAGNOSIS — I10 Essential (primary) hypertension: Secondary | ICD-10-CM | POA: Diagnosis not present

## 2022-02-22 DIAGNOSIS — F411 Generalized anxiety disorder: Secondary | ICD-10-CM | POA: Diagnosis not present

## 2022-02-22 DIAGNOSIS — Z6827 Body mass index (BMI) 27.0-27.9, adult: Secondary | ICD-10-CM | POA: Diagnosis not present

## 2022-02-22 DIAGNOSIS — R7301 Impaired fasting glucose: Secondary | ICD-10-CM | POA: Diagnosis not present

## 2022-02-22 DIAGNOSIS — E034 Atrophy of thyroid (acquired): Secondary | ICD-10-CM | POA: Diagnosis not present

## 2022-03-11 DIAGNOSIS — Z6828 Body mass index (BMI) 28.0-28.9, adult: Secondary | ICD-10-CM | POA: Diagnosis not present

## 2022-03-11 DIAGNOSIS — J0100 Acute maxillary sinusitis, unspecified: Secondary | ICD-10-CM | POA: Diagnosis not present

## 2022-04-18 DIAGNOSIS — Z6828 Body mass index (BMI) 28.0-28.9, adult: Secondary | ICD-10-CM | POA: Diagnosis not present

## 2022-04-18 DIAGNOSIS — H6692 Otitis media, unspecified, left ear: Secondary | ICD-10-CM | POA: Diagnosis not present

## 2022-05-02 ENCOUNTER — Other Ambulatory Visit: Payer: Self-pay

## 2022-05-02 MED ORDER — MONTELUKAST SODIUM 10 MG PO TABS: 10.0000 mg | ORAL_TABLET | Freq: Every day | ORAL | 0 refills | Status: AC

## 2022-05-02 NOTE — Telephone Encounter (Signed)
Please call for fasting physical exam. Good for Carlos Smith.  Dr. Tobie Poet

## 2022-06-03 ENCOUNTER — Telehealth: Payer: Self-pay

## 2022-06-03 NOTE — Telephone Encounter (Signed)
I called the patient today to see about getting an appointment scheduled for a chronic med f.up. Looks like this patient is currently overdue for an appointment. I was unable to leave a message for the patient.

## 2022-08-20 DIAGNOSIS — J0100 Acute maxillary sinusitis, unspecified: Secondary | ICD-10-CM | POA: Diagnosis not present

## 2022-08-20 DIAGNOSIS — F411 Generalized anxiety disorder: Secondary | ICD-10-CM | POA: Diagnosis not present

## 2022-08-20 DIAGNOSIS — E034 Atrophy of thyroid (acquired): Secondary | ICD-10-CM | POA: Diagnosis not present

## 2022-08-20 DIAGNOSIS — I10 Essential (primary) hypertension: Secondary | ICD-10-CM | POA: Diagnosis not present

## 2022-08-28 DIAGNOSIS — J069 Acute upper respiratory infection, unspecified: Secondary | ICD-10-CM | POA: Diagnosis not present

## 2022-08-28 DIAGNOSIS — Z6829 Body mass index (BMI) 29.0-29.9, adult: Secondary | ICD-10-CM | POA: Diagnosis not present

## 2022-09-04 DIAGNOSIS — Z6829 Body mass index (BMI) 29.0-29.9, adult: Secondary | ICD-10-CM | POA: Diagnosis not present

## 2022-09-04 DIAGNOSIS — R059 Cough, unspecified: Secondary | ICD-10-CM | POA: Diagnosis not present

## 2022-11-22 DIAGNOSIS — F411 Generalized anxiety disorder: Secondary | ICD-10-CM | POA: Diagnosis not present

## 2022-11-22 DIAGNOSIS — E781 Pure hyperglyceridemia: Secondary | ICD-10-CM | POA: Diagnosis not present

## 2022-11-22 DIAGNOSIS — I10 Essential (primary) hypertension: Secondary | ICD-10-CM | POA: Diagnosis not present

## 2022-11-22 DIAGNOSIS — E034 Atrophy of thyroid (acquired): Secondary | ICD-10-CM | POA: Diagnosis not present

## 2022-11-22 DIAGNOSIS — M25511 Pain in right shoulder: Secondary | ICD-10-CM | POA: Diagnosis not present

## 2022-11-22 DIAGNOSIS — M25562 Pain in left knee: Secondary | ICD-10-CM | POA: Diagnosis not present

## 2022-12-09 DIAGNOSIS — Z6829 Body mass index (BMI) 29.0-29.9, adult: Secondary | ICD-10-CM | POA: Diagnosis not present

## 2022-12-09 DIAGNOSIS — E291 Testicular hypofunction: Secondary | ICD-10-CM | POA: Diagnosis not present

## 2022-12-09 DIAGNOSIS — E162 Hypoglycemia, unspecified: Secondary | ICD-10-CM | POA: Diagnosis not present

## 2022-12-24 DIAGNOSIS — E039 Hypothyroidism, unspecified: Secondary | ICD-10-CM | POA: Diagnosis not present

## 2022-12-24 DIAGNOSIS — K900 Celiac disease: Secondary | ICD-10-CM | POA: Diagnosis not present

## 2022-12-24 DIAGNOSIS — I10 Essential (primary) hypertension: Secondary | ICD-10-CM | POA: Diagnosis not present

## 2022-12-24 DIAGNOSIS — E162 Hypoglycemia, unspecified: Secondary | ICD-10-CM | POA: Diagnosis not present

## 2022-12-24 DIAGNOSIS — E291 Testicular hypofunction: Secondary | ICD-10-CM | POA: Diagnosis not present

## 2023-01-01 DIAGNOSIS — E039 Hypothyroidism, unspecified: Secondary | ICD-10-CM | POA: Diagnosis not present

## 2023-01-01 DIAGNOSIS — E291 Testicular hypofunction: Secondary | ICD-10-CM | POA: Diagnosis not present

## 2023-01-01 DIAGNOSIS — K900 Celiac disease: Secondary | ICD-10-CM | POA: Diagnosis not present

## 2023-01-01 DIAGNOSIS — I10 Essential (primary) hypertension: Secondary | ICD-10-CM | POA: Diagnosis not present

## 2023-01-27 DIAGNOSIS — M25512 Pain in left shoulder: Secondary | ICD-10-CM | POA: Diagnosis not present

## 2023-03-10 ENCOUNTER — Ambulatory Visit
Admission: EM | Admit: 2023-03-10 | Discharge: 2023-03-10 | Disposition: A | Discharge status: Home / Self Care | Payer: BC Managed Care – PPO

## 2023-03-10 DIAGNOSIS — J309 Allergic rhinitis, unspecified: Secondary | ICD-10-CM

## 2023-03-10 DIAGNOSIS — J0100 Acute maxillary sinusitis, unspecified: Secondary | ICD-10-CM

## 2023-03-10 DIAGNOSIS — J3489 Other specified disorders of nose and nasal sinuses: Secondary | ICD-10-CM

## 2023-03-10 MED ORDER — AZITHROMYCIN 250 MG PO TABS: 250.0000 mg | ORAL_TABLET | Freq: Every day | ORAL | 0 refills | Status: DC

## 2023-03-10 MED ORDER — FEXOFENADINE HCL 180 MG PO TABS: 180.0000 mg | ORAL_TABLET | Freq: Every day | ORAL | 0 refills | Status: DC

## 2023-03-10 MED ORDER — METHYLPREDNISOLONE SODIUM SUCC 125 MG IJ SOLR
125.0000 mg | Freq: Once | INTRAMUSCULAR | Status: AC
  Administered 2023-03-10: 125 mg via INTRAMUSCULAR

## 2023-03-10 MED ORDER — PREDNISONE 20 MG PO TABS: ORAL_TABLET | ORAL | 0 refills | Status: DC

## 2023-03-10 NOTE — Discharge Instructions (Addendum)
Advised patient to take medications as directed with food to completion.  Advised patient to take prednisone and Allegra with Zithromax daily for the next 5 days.  Encouraged increase daily water intake to 64 ounces per day while taking these medications.  Advised if symptoms worsen and/or unresolved please follow-up with PCP or here for further evaluation.

## 2023-03-10 NOTE — ED Provider Notes (Signed)
[facility]    CSN: [id number] Arrival date & time: 03/10/23  9323      History   Chief Complaint Chief Complaint  Patient presents with   Facial Pain    HPI Carlos Smith is a 32 y.o. male.   HPI pleasant 32 year old male presents with facial pain and sinus nasal congestion for 6 days.  Patient reports taking some leftover amoxicillin with little to no improvement.  PMH significant for panic disorder, bipolar 2 disorder, and HTN.  Past Medical History:  Diagnosis Date   Gastroesophageal reflux disease    Hypertension    Hypothyroidism     Patient Active Problem List   Diagnosis Date Noted   COVID 05/30/2021   Panic disorder 07/17/2020   Bipolar 2 disorder (HCC) 07/17/2020   Celiac disease 05/15/2020   Erythema nodosum 05/15/2020   Chronic diarrhea 05/09/2020   Abnormal stress test 06/18/2017   Atrophy of thyroid 06/11/2017   Gastro-esophageal reflux disease without esophagitis 06/11/2017   Hypertension 06/11/2017   Hyperlipidemia 02/08/2014    Past Surgical History:  Procedure Laterality Date   NASAL SEPTUM SURGERY  05/2018       Home Medications    Prior to Admission medications   Medication Sig Start Date End Date Taking? Authorizing Provider  azithromycin (ZITHROMAX) 250 MG tablet Take 1 tablet (250 mg total) by mouth daily. Take first 2 tablets together, then 1 every day until finished. 03/10/23  Yes Trevor Iha, FNP  fexofenadine [facility] ALLERGY) 180 MG tablet Take 1 tablet (180 mg total) by mouth daily for 15 days. 03/10/23 03/25/23 Yes Trevor Iha, FNP  predniSONE (DELTASONE) 20 MG tablet Take 3 tabs PO daily x 5 days. 03/10/23  Yes Trevor Iha, FNP  escitalopram (LEXAPRO) 10 MG tablet Take 10 mg by mouth daily.    [provider]  levothyroxine (SYNTHROID) 25 MCG tablet TAKE 1 TABLET BY MOUTH ONCE DAILY 06/13/21   Abigail Miyamoto, MD  lisinopril (ZESTRIL) 10 MG tablet TAKE ONE TABLET BY MOUTH DAILY 06/13/21    Abigail Miyamoto, MD  montelukast (SINGULAIR) 10 MG tablet Take 1 tablet (10 mg total) by mouth daily. 05/02/22   CoxFritzi Mandes, MD  Testosterone Cypionate 200 MG/ML SOLN Inject 200 mg as directed once a week.    [provider]    Family History Family History  Problem Relation Age of Onset   Hypertension Mother    Hypertension Father    Migraines Father    Cerebrovascular Accident Father    Stroke Father    Hypertension Sister    Hypertension Brother     Social History Social History   Tobacco Use   Smoking status: Never   Smokeless tobacco: Former    Types: Chew  Substance Use Topics   Alcohol use: Yes    Alcohol/week: 4.0 standard drinks of alcohol    Types: 4 Cans of beer per week    Comment: twice a week   Drug use: No     Allergies   Augmentin [amoxicillin-pot clavulanate]   Review of Systems Review of Systems  HENT:  Positive for congestion, sinus pressure and sinus pain.   All other systems reviewed and are negative.    Physical Exam Triage Vital Signs ED Triage Vitals  Encounter Vitals Group     BP 03/10/23 0839 123/87     Systolic BP Percentile --      Diastolic BP Percentile --      Pulse Rate 03/10/23 0839 85  Resp 03/10/23 0839 16     Temp 03/10/23 0839 98.4 F (36.9 C)     Temp src --      SpO2 03/10/23 0839 98 %     Weight --      Height --      Head Circumference --      Peak Flow --      Pain Score 03/10/23 0838 0     Pain Loc --      Pain Education --      Exclude from Growth Chart --    No data found.  Updated Vital Signs BP 123/87   Pulse 85   Temp 98.4 F (36.9 C)   Resp 16   SpO2 98%    Physical Exam Vitals and nursing note reviewed.  Constitutional:      Appearance: Normal appearance. He is normal weight.  HENT:     Head: Normocephalic and atraumatic.     Right Ear: Tympanic membrane and external ear normal.     Left Ear: Tympanic membrane and external ear normal.     Nose:     Right  Turbinates: Enlarged.     Left Turbinates: Enlarged.     Right Sinus: Maxillary sinus tenderness and frontal sinus tenderness present.     Left Sinus: Maxillary sinus tenderness and frontal sinus tenderness present.     Mouth/Throat:     Mouth: Mucous membranes are moist.     Pharynx: Oropharynx is clear.     Comments: Significant amount of clear drainage of posterior oropharynx noted Eyes:     Extraocular Movements: Extraocular movements intact.     Conjunctiva/sclera: Conjunctivae normal.     Pupils: Pupils are equal, round, and reactive to light.  Cardiovascular:     Rate and Rhythm: Normal rate and regular rhythm.     Pulses: Normal pulses.     Heart sounds: Normal heart sounds.  Pulmonary:     Effort: Pulmonary effort is normal.     Breath sounds: Normal breath sounds. No wheezing, rhonchi or rales.  Musculoskeletal:        General: Normal range of motion.     Cervical back: Normal range of motion and neck supple.  Skin:    General: Skin is warm and dry.  Neurological:     General: No focal deficit present.     Mental Status: He is alert and oriented to person, place, and time. Mental status is at baseline.  Psychiatric:        Mood and Affect: Mood normal.        Behavior: Behavior normal.      UC Treatments / Results  Labs (all labs ordered are listed, but only abnormal results are displayed) Labs Reviewed - No data to display  EKG   Radiology No results found.  Procedures Procedures (including critical care time)  Medications Ordered in UC Medications  methylPREDNISolone sodium succinate (SOLU-MEDROL) 125 mg/2 mL injection 125 mg (125 mg Intramuscular Given 03/10/23 0939)    Initial Impression / Assessment and Plan / UC Course  I have reviewed the triage vital signs and the nursing notes.  Pertinent labs & imaging results that were available during my care of the patient were reviewed by me and considered in my medical decision making (see chart for  details).     MDM: 1.  Acute maxillary sinusitis, recurrence not specified-Rx'd Zithromax (500 mg day 1, then 250 mg day 2-5); 2.  Sinus pressure-IM Solu-Medrol 125 given  once in clinic and prior to discharge Rx'd prednisone 20 mg tablet: Take 3 tablets p.o. daily x 5 days; 3.  Allergic rhinitis, unspecified seasonality, unspecified trigger-Rx'd Allegra 180 mg fexofenadine daily x 5 days, then as needed. Advised patient to take medications as directed with food to completion.  Advised patient to take prednisone and Allegra with Zithromax daily for the next 5 days.  Encouraged increase daily water intake to 64 ounces per day while taking these medications.  Advised if symptoms worsen and/or unresolved please follow-up with PCP or here for further evaluation. Final Clinical Impressions(s) / UC Diagnoses   Final diagnoses:  Acute maxillary sinusitis, recurrence not specified  Sinus pressure  Allergic rhinitis, unspecified seasonality, unspecified trigger     Discharge Instructions      Advised patient to take medications as directed with food to completion.  Advised patient to take prednisone and Allegra with Zithromax daily for the next 5 days.  Encouraged increase daily water intake to 64 ounces per day while taking these medications.  Advised if symptoms worsen and/or unresolved please follow-up with PCP or here for further evaluation.     ED Prescriptions     Medication Sig Dispense Auth. Provider   azithromycin (ZITHROMAX) 250 MG tablet Take 1 tablet (250 mg total) by mouth daily. Take first 2 tablets together, then 1 every day until finished. 6 tablet Trevor Iha, FNP   predniSONE (DELTASONE) 20 MG tablet Take 3 tabs PO daily x 5 days. 15 tablet Trevor Iha, FNP   fexofenadine [facility] ALLERGY) 180 MG tablet Take 1 tablet (180 mg total) by mouth daily for 15 days. 15 tablet Trevor Iha, FNP      PDMP not reviewed this encounter.   Trevor Iha, FNP 03/10/23 [phone]

## 2023-03-10 NOTE — ED Triage Notes (Signed)
Pt presents to uc with co of sinus pain and congestion since last Tuesday. Pt has taken multiple otc with minimal improvement. Pt reports he did also take some left over amoxicillin with no improvement.

## 2023-03-31 ENCOUNTER — Ambulatory Visit
Admission: EM | Admit: 2023-03-31 | Discharge: 2023-03-31 | Disposition: A | Discharge status: Home / Self Care | Payer: Self-pay | Attending: Family Medicine | Admitting: Family Medicine

## 2023-03-31 DIAGNOSIS — J209 Acute bronchitis, unspecified: Secondary | ICD-10-CM

## 2023-03-31 MED ORDER — PREDNISONE 50 MG PO TABS: ORAL_TABLET | ORAL | 0 refills | Status: DC

## 2023-03-31 MED ORDER — HYDROCOD POLI-CHLORPHE POLI ER 10-8 MG/5ML PO SUER: 5.0000 mL | Freq: Two times a day (BID) | ORAL | 0 refills | Status: DC | PRN

## 2023-03-31 MED ORDER — DOXYCYCLINE HYCLATE 100 MG PO CAPS: 100.0000 mg | ORAL_CAPSULE | Freq: Two times a day (BID) | ORAL | 0 refills | Status: DC

## 2023-03-31 NOTE — ED Provider Notes (Signed)
[facility]    CSN: [id number] Arrival date & time: 03/31/23  2536      History   Chief Complaint Chief Complaint  Patient presents with   Cough    HPI Akaash Pettersen is a 32 y.o. male.   Patient states he has been coughing for the better part of a month.  He came here and was treated for maxillary sinusitis.  He took a Z-Pak, some prednisone, and felt briefly improved but never better.  He states ever since then he is continue to have nasal congestion, postnasal drip, and cough.  He states he has chest congestion.  He states he feels tired.  The cough is keeping him awake at night.    Past Medical History:  Diagnosis Date   Gastroesophageal reflux disease    Hypertension    Hypothyroidism     Patient Active Problem List   Diagnosis Date Noted   COVID 05/30/2021   Panic disorder 07/17/2020   Bipolar 2 disorder (HCC) 07/17/2020   Celiac disease 05/15/2020   Erythema nodosum 05/15/2020   Chronic diarrhea 05/09/2020   Abnormal stress test 06/18/2017   Atrophy of thyroid 06/11/2017   Gastro-esophageal reflux disease without esophagitis 06/11/2017   Hypertension 06/11/2017   Hyperlipidemia 02/08/2014    Past Surgical History:  Procedure Laterality Date   NASAL SEPTUM SURGERY  05/2018       Home Medications    Prior to Admission medications   Medication Sig Start Date End Date Taking? Authorizing Provider  chlorpheniramine-HYDROcodone (TUSSIONEX) 10-8 MG/5ML Take 5 mLs by mouth every 12 (twelve) hours as needed for cough. 03/31/23  Yes Eustace Moore, MD  doxycycline (VIBRAMYCIN) 100 MG capsule Take 1 capsule (100 mg total) by mouth 2 (two) times daily. 03/31/23  Yes Eustace Moore, MD  predniSONE (DELTASONE) 50 MG tablet Take once a day for 5 days.  Take with food 03/31/23  Yes Eustace Moore, MD  escitalopram (LEXAPRO) 10 MG tablet Take 10 mg by mouth daily.    [provider]  levothyroxine (SYNTHROID) 25 MCG tablet TAKE 1  TABLET BY MOUTH ONCE DAILY 06/13/21   Abigail Miyamoto, MD  lisinopril (ZESTRIL) 10 MG tablet TAKE ONE TABLET BY MOUTH DAILY 06/13/21   Abigail Miyamoto, MD  montelukast (SINGULAIR) 10 MG tablet Take 1 tablet (10 mg total) by mouth daily. 05/02/22   CoxFritzi Mandes, MD  Testosterone Cypionate 200 MG/ML SOLN Inject 200 mg as directed once a week.    [provider]    Family History Family History  Problem Relation Age of Onset   Hypertension Mother    Hypertension Father    Migraines Father    Cerebrovascular Accident Father    Stroke Father    Hypertension Sister    Hypertension Brother     Social History Social History   Tobacco Use   Smoking status: Never   Smokeless tobacco: Former    Types: Chew  Substance Use Topics   Alcohol use: Yes    Alcohol/week: 4.0 standard drinks of alcohol    Types: 4 Cans of beer per week    Comment: twice a week   Drug use: No     Allergies   Augmentin [amoxicillin-pot clavulanate]   Review of Systems Review of Systems See HPI  Physical Exam Triage Vital Signs ED Triage Vitals [03/31/23 0940]  Encounter Vitals Group     BP (!) 140/96     Systolic BP Percentile  Diastolic BP Percentile      Pulse Rate 89     Resp 17     Temp 98.1 F (36.7 C)     Temp Source Oral     SpO2 98 %     Weight      Height      Head Circumference      Peak Flow      Pain Score 0     Pain Loc      Pain Education      Exclude from Growth Chart    No data found.  Updated Vital Signs BP (!) 140/96 (BP Location: Right Arm)   Pulse 89   Temp 98.1 F (36.7 C) (Oral)   Resp 17   SpO2 98%      Physical Exam Constitutional:      General: He is not in acute distress.    Appearance: He is well-developed.  HENT:     Head: Normocephalic and atraumatic.     Right Ear: Tympanic membrane and ear canal normal.     Left Ear: Tympanic membrane and ear canal normal.     Nose: Congestion and rhinorrhea present.     Mouth/Throat:      Pharynx: No posterior oropharyngeal erythema.  Eyes:     Conjunctiva/sclera: Conjunctivae normal.     Pupils: Pupils are equal, round, and reactive to light.  Cardiovascular:     Rate and Rhythm: Normal rate.     Heart sounds: Normal heart sounds.  Pulmonary:     Effort: Pulmonary effort is normal. No respiratory distress.     Breath sounds: Rhonchi present.  Abdominal:     General: There is no distension.     Palpations: Abdomen is soft.  Musculoskeletal:        General: Normal range of motion.     Cervical back: Normal range of motion.  Skin:    General: Skin is warm and dry.  Neurological:     Mental Status: He is alert.      UC Treatments / Results  Labs (all labs ordered are listed, but only abnormal results are displayed) Labs Reviewed - No data to display  EKG   Radiology No results found.  Procedures Procedures (including critical care time)  Medications Ordered in UC Medications - No data to display  Initial Impression / Assessment and Plan / UC Course  I have reviewed the triage vital signs and the nursing notes.  Pertinent labs & imaging results that were available during my care of the patient were reviewed by me and considered in my medical decision making (see chart for details).     Explained that bronchitis can sometimes leave patient with bronchial inflammation that will cause a persistent cough for 4 to 6 weeks.  Since he is still coughing sputum and feels tired, will give another course of antibiotics.  See PCP if fails to improve Final Clinical Impressions(s) / UC Diagnoses   Final diagnoses:  Acute bronchitis, unspecified organism     Discharge Instructions      Continue to drink lots of fluids I have given you a prescription cough medicine to take at bedtime.  During the day it might cause drowsiness Take the antibiotic doxycycline 2 times a day for 10 days.  Be sure to take this with food Take prednisone as directed Call or  return if not improving in a week   ED Prescriptions     Medication Sig Dispense Auth. Provider  doxycycline (VIBRAMYCIN) 100 MG capsule Take 1 capsule (100 mg total) by mouth 2 (two) times daily. 20 capsule Eustace Moore, MD   predniSONE (DELTASONE) 50 MG tablet Take once a day for 5 days.  Take with food 5 tablet Eustace Moore, MD   chlorpheniramine-HYDROcodone (TUSSIONEX) 10-8 MG/5ML Take 5 mLs by mouth every 12 (twelve) hours as needed for cough. 115 mL Eustace Moore, MD      I have reviewed the PDMP during this encounter.   Eustace Moore, MD 03/31/23 [phone]

## 2023-03-31 NOTE — Discharge Instructions (Signed)
Continue to drink lots of fluids I have given you a prescription cough medicine to take at bedtime.  During the day it might cause drowsiness Take the antibiotic doxycycline 2 times a day for 10 days.  Be sure to take this with food Take prednisone as directed Call or return if not improving in a week

## 2023-03-31 NOTE — ED Triage Notes (Signed)
Pt c/o cough and congestion x 3.5 weeks. Was seen on 11/25 for same sxs. Tx with abx and steroids. Says he finished meds and felt ok for 2-3 days then sxs returned a week ago. Taking OTC meds prn.

## 2023-12-01 ENCOUNTER — Encounter: Payer: Self-pay | Admitting: Emergency Medicine

## 2023-12-01 ENCOUNTER — Ambulatory Visit
Admission: EM | Admit: 2023-12-01 | Discharge: 2023-12-01 | Disposition: A | Discharge status: Home / Self Care | Attending: Family Medicine | Admitting: Family Medicine

## 2023-12-01 DIAGNOSIS — R0981 Nasal congestion: Secondary | ICD-10-CM

## 2023-12-01 DIAGNOSIS — J309 Allergic rhinitis, unspecified: Secondary | ICD-10-CM

## 2023-12-01 DIAGNOSIS — J0100 Acute maxillary sinusitis, unspecified: Secondary | ICD-10-CM | POA: Diagnosis not present

## 2023-12-01 LAB — POC SARS CORONAVIRUS 2 AG -  ED: SARS Coronavirus 2 Ag: NEGATIVE

## 2023-12-01 MED ORDER — PREDNISONE 20 MG PO TABS: ORAL_TABLET | ORAL | 0 refills | Status: DC

## 2023-12-01 MED ORDER — AZITHROMYCIN 250 MG PO TABS: 250.0000 mg | ORAL_TABLET | Freq: Every day | ORAL | 0 refills | Status: DC

## 2023-12-01 MED ORDER — FEXOFENADINE HCL 180 MG PO TABS: 180.0000 mg | ORAL_TABLET | Freq: Every day | ORAL | 0 refills | Status: AC

## 2023-12-01 NOTE — ED Triage Notes (Signed)
 Patient c/o sinus drainage, body aches, congestion, febrile x 3 days.  Patient is taken Nyquil and Dayquil.

## 2023-12-01 NOTE — ED Provider Notes (Signed)
 [name]    CSN: [id number] Arrival date & time: 12/01/23  0806      History   Chief Complaint Chief Complaint  Patient presents with   Sinus Drainage    HPI Carlos Smith is a 33 y.o. male.   HPI 33 year old male presents with sinus drainage, body aches, congestion and fever for 3 days.  PMH significant for HTN, GERD, and hypothyroidism.  Past Medical History:  Diagnosis Date   Gastroesophageal reflux disease    Hypertension    Hypothyroidism     Patient Active Problem List   Diagnosis Date Noted   COVID 05/30/2021   Panic disorder 07/17/2020   Bipolar 2 disorder (HCC) 07/17/2020   Celiac disease 05/15/2020   Erythema nodosum 05/15/2020   Chronic diarrhea 05/09/2020   Abnormal stress test 06/18/2017   Atrophy of thyroid  06/11/2017   Gastro-esophageal reflux disease without esophagitis 06/11/2017   Hypertension 06/11/2017   Hyperlipidemia 02/08/2014    Past Surgical History:  Procedure Laterality Date   NASAL SEPTUM SURGERY  05/2018       Home Medications    Prior to Admission medications   Medication Sig Start Date End Date Taking? Authorizing Provider  azithromycin  (ZITHROMAX ) 250 MG tablet Take 1 tablet (250 mg total) by mouth daily. Take first 2 tablets together, then 1 every day until finished. 12/01/23  Yes Teddy Sharper, FNP  escitalopram (LEXAPRO) 10 MG tablet Take 10 mg by mouth daily.   Yes [provider]  fexofenadine  (ALLEGRA  ALLERGY) 180 MG tablet Take 1 tablet (180 mg total) by mouth daily for 15 days. 12/01/23 12/16/23 Yes Teddy Sharper, FNP  levothyroxine  (SYNTHROID ) 25 MCG tablet TAKE 1 TABLET BY MOUTH ONCE DAILY 06/13/21  Yes Abran Jerilynn Loving, MD  lisinopril  (ZESTRIL ) 10 MG tablet TAKE ONE TABLET BY MOUTH DAILY 06/13/21  Yes Abran Jerilynn Loving, MD  montelukast  (SINGULAIR ) 10 MG tablet Take 1 tablet (10 mg total) by mouth daily. 05/02/22  Yes Cox, Kirsten, MD  predniSONE  (DELTASONE ) 20 MG tablet Take 3 tabs PO  daily x 5 days. 12/01/23  Yes Teddy Sharper, FNP  Testosterone  Cypionate 200 MG/ML SOLN Inject 200 mg as directed once a week.   Yes [provider]    Family History Family History  Problem Relation Age of Onset   Hypertension Mother    Hypertension Father    Migraines Father    Cerebrovascular Accident Father    Stroke Father    Hypertension Sister    Hypertension Brother     Social History Social History   Tobacco Use   Smoking status: Never   Smokeless tobacco: Former    Types: Engineer, drilling   Vaping status: Never Used  Substance Use Topics   Alcohol use: Yes    Alcohol/week: 4.0 standard drinks of alcohol    Types: 4 Cans of beer per week    Comment: twice a week   Drug use: No     Allergies   Augmentin  [amoxicillin -pot clavulanate]   Review of Systems Review of Systems  HENT:  Positive for congestion, postnasal drip, sinus pressure and sinus pain.   All other systems reviewed and are negative.    Physical Exam Triage Vital Signs ED Triage Vitals  Encounter Vitals Group     BP 12/01/23 0827 119/78     Girls Systolic BP Percentile --      Girls Diastolic BP Percentile --      Boys Systolic BP Percentile --  Boys Diastolic BP Percentile --      Pulse Rate 12/01/23 0827 69     Resp 12/01/23 0827 18     Temp 12/01/23 0827 98.3 F (36.8 C)     Temp Source 12/01/23 0827 Oral     SpO2 12/01/23 0827 97 %     Weight 12/01/23 0829 200 lb (90.7 kg)     Height 12/01/23 0829 5' 10 (1.778 m)     Head Circumference --      Peak Flow --      Pain Score 12/01/23 0829 0     Pain Loc --      Pain Education --      Exclude from Growth Chart --    No data found.  Updated Vital Signs BP 119/78 (BP Location: Right Arm)   Pulse 69   Temp 98.3 F (36.8 C) (Oral)   Resp 18   Ht 5' 10 (1.778 m)   Wt 200 lb (90.7 kg)   SpO2 97%   BMI 28.70 kg/m    Physical Exam Vitals and nursing note reviewed.  Constitutional:      Appearance: Normal  appearance. He is normal weight. He is ill-appearing.  HENT:     Head: Normocephalic and atraumatic.     Right Ear: Tympanic membrane and external ear normal.     Left Ear: Tympanic membrane and external ear normal.     Ears:     Comments: Significant eustachian tube dysfunction noted bilaterally    Nose:     Right Sinus: Maxillary sinus tenderness present.     Left Sinus: Maxillary sinus tenderness present.     Comments: Turbinates are erythematous/edematous    Mouth/Throat:     Mouth: Mucous membranes are moist.     Pharynx: Oropharynx is clear.     Comments: Significant amount of clear drainage of posterior oropharynx noted Eyes:     Extraocular Movements: Extraocular movements intact.     Pupils: Pupils are equal, round, and reactive to light.  Cardiovascular:     Rate and Rhythm: Normal rate and regular rhythm.     Pulses: Normal pulses.     Heart sounds: Normal heart sounds.  Pulmonary:     Effort: Pulmonary effort is normal.     Breath sounds: Normal breath sounds. No wheezing, rhonchi or rales.  Musculoskeletal:        General: Normal range of motion.  Skin:    General: Skin is warm and dry.  Neurological:     General: No focal deficit present.     Mental Status: He is alert and oriented to person, place, and time. Mental status is at baseline.      UC Treatments / Results  Labs (all labs ordered are listed, but only abnormal results are displayed) Labs Reviewed  POC SARS CORONAVIRUS 2 AG -  ED    EKG   Radiology No results found.  Procedures Procedures (including critical care time)  Medications Ordered in UC Medications - No data to display  Initial Impression / Assessment and Plan / UC Course  I have reviewed the triage vital signs and the nursing notes.  Pertinent labs & imaging results that were available during my care of the patient were reviewed by me and considered in my medical decision making (see chart for details).     Acute maxillary  sinusitis, recurrence not specified-Rx'd Zithromax  (500 mg day 1, then 250 mg day 2-5); 2.  Nasal sinus congestion-Rx'd prednisone  20  mg tablet, take 3 tablets p.o. daily x 5 days; 3.  Allergic rhinitis, unspecified seasonality, unspecified trigger-Rx'd Allegra  180 mg tablet, take 1 tablet daily x 5 days, then as needed. Advised patient to take medications as directed with food to completion.  Advised patient to take prednisone  and Allegra  with Zithromax  daily for the next 5 days.  Advised may use Allegra  as needed afterwards for concurrent postnasal drainage/drip.  Encouraged to increase daily water intake to 64 ounces per day while taking these medications.  Advised if symptoms worsen and/or unresolved please follow-up your PCP or here for further evaluation.  Patient discharged home, hemodynamically stable. Final Clinical Impressions(s) / UC Diagnoses   Final diagnoses:  Acute maxillary sinusitis, recurrence not specified  Nasal sinus congestion  Allergic rhinitis, unspecified seasonality, unspecified trigger     Discharge Instructions      Advised patient to take medications as directed with food to completion.  Advised patient to take prednisone  and Allegra  with Zithromax  daily for the next 5 days.  Advised may use Allegra  as needed afterwards for concurrent postnasal drainage/drip.  Encouraged to increase daily water intake to 64 ounces per day while taking these medications.  Advised if symptoms worsen and/or unresolved please follow-up your PCP or here for further evaluation.     ED Prescriptions     Medication Sig Dispense Auth. Provider   azithromycin  (ZITHROMAX ) 250 MG tablet Take 1 tablet (250 mg total) by mouth daily. Take first 2 tablets together, then 1 every day until finished. 6 tablet Carlos Annas, FNP   predniSONE  (DELTASONE ) 20 MG tablet Take 3 tabs PO daily x 5 days. 15 tablet Carlos Boot, FNP   fexofenadine  (ALLEGRA  ALLERGY) 180 MG tablet Take 1 tablet (180 mg total)  by mouth daily for 15 days. 15 tablet Carlos Ziegler, FNP      PDMP not reviewed this encounter.   Teddy Sharper, FNP 12/01/23 [phone]

## 2023-12-01 NOTE — Discharge Instructions (Addendum)
 Advised patient to take medications as directed with food to completion.  Advised patient to take prednisone  and Allegra  with Zithromax  daily for the next 5 days.  Advised may use Allegra  as needed afterwards for concurrent postnasal drainage/drip.  Encouraged to increase daily water intake to 64 ounces per day while taking these medications.  Advised if symptoms worsen and/or unresolved please follow-up your PCP or here for further evaluation.

## 2024-03-05 ENCOUNTER — Encounter: Payer: Self-pay | Admitting: Emergency Medicine

## 2024-03-05 ENCOUNTER — Ambulatory Visit
Admission: EM | Admit: 2024-03-05 | Discharge: 2024-03-05 | Disposition: A | Discharge status: Home / Self Care | Attending: Family Medicine | Admitting: Family Medicine

## 2024-03-05 DIAGNOSIS — J0101 Acute recurrent maxillary sinusitis: Secondary | ICD-10-CM | POA: Diagnosis not present

## 2024-03-05 MED ORDER — DOXYCYCLINE HYCLATE 100 MG PO CAPS: ORAL_CAPSULE | ORAL | 0 refills | Status: AC

## 2024-03-05 MED ORDER — PREDNISONE 20 MG PO TABS: ORAL_TABLET | ORAL | 0 refills | Status: AC

## 2024-03-05 NOTE — ED Provider Notes (Signed)
 [name]    CSN: [id number] Arrival date & time: 03/05/24  1343      History   Chief Complaint Chief Complaint  Patient presents with   Sinus Drainage    HPI Carlos Smith is a 33 y.o. male.   Three days ago patient developed a frontal headache, sweats, fatigue, decreased appetite, increased nasal mucous drainage, and minimal sore throat. He denies cough. He has a long history of sinus problems, having had a septoplasty in 2021.  The history is provided by the patient.    Past Medical History:  Diagnosis Date   Gastroesophageal reflux disease    Hypertension    Hypothyroidism     Patient Active Problem List   Diagnosis Date Noted   COVID 05/30/2021   Panic disorder 07/17/2020   Bipolar 2 disorder (HCC) 07/17/2020   Celiac disease 05/15/2020   Erythema nodosum 05/15/2020   Chronic diarrhea 05/09/2020   Abnormal stress test 06/18/2017   Atrophy of thyroid  06/11/2017   Gastro-esophageal reflux disease without esophagitis 06/11/2017   Hypertension 06/11/2017   Hyperlipidemia 02/08/2014    Past Surgical History:  Procedure Laterality Date   NASAL SEPTUM SURGERY  05/2018       Home Medications    Prior to Admission medications   Medication Sig Start Date End Date Taking? Authorizing Provider  doxycycline  (VIBRAMYCIN ) 100 MG capsule Take one cap PO Q12hr with food. 03/05/24  Yes Pauline Garnette LABOR, MD  escitalopram (LEXAPRO) 10 MG tablet Take 10 mg by mouth daily.   Yes [provider]  hydrOXYzine (ATARAX) 10 MG tablet Take 10 mg by mouth 3 (three) times daily as needed. 01/23/24  Yes [provider]  levothyroxine  (SYNTHROID ) 25 MCG tablet TAKE 1 TABLET BY MOUTH ONCE DAILY 06/13/21  Yes Abran Jerilynn Loving, MD  lisinopril  (ZESTRIL ) 10 MG tablet TAKE ONE TABLET BY MOUTH DAILY 06/13/21  Yes Abran Jerilynn Loving, MD  montelukast  (SINGULAIR ) 10 MG tablet Take 1 tablet (10 mg total) by mouth daily. 05/02/22  Yes Cox, Kirsten, MD   predniSONE  (DELTASONE ) 20 MG tablet Take one tab by mouth twice daily for 4 days, then one daily. Take with food. 03/05/24  Yes Pauline Garnette LABOR, MD  Testosterone  Cypionate 200 MG/ML SOLN Inject 200 mg as directed once a week.   Yes [provider]  fexofenadine  (ALLEGRA  ALLERGY) 180 MG tablet Take 1 tablet (180 mg total) by mouth daily for 15 days. 12/01/23 12/16/23  Teddy Sharper, FNP    Family History Family History  Problem Relation Age of Onset   Hypertension Mother    Hypertension Father    Migraines Father    Cerebrovascular Accident Father    Stroke Father    Hypertension Sister    Hypertension Brother     Social History Social History   Tobacco Use   Smoking status: Never   Smokeless tobacco: Former    Types: Engineer, Drilling   Vaping status: Never Used  Substance Use Topics   Alcohol use: Yes    Alcohol/week: 4.0 standard drinks of alcohol    Types: 4 Cans of beer per week    Comment: twice a week   Drug use: No     Allergies   Augmentin  [amoxicillin -pot clavulanate]   Review of Systems Review of Systems + sore throat No cough No pleuritic pain No wheezing + nasal congestion + post-nasal drainage + sinus pain/pressure No itchy/red eyes No earache No hemoptysis No SOB No fever, + sweats  No nausea No vomiting No abdominal pain No diarrhea No urinary symptoms No skin rash + fatigue No myalgias + headache Used OTC meds (Mucinex Max) without relief   Physical Exam Triage Vital Signs ED Triage Vitals  Encounter Vitals Group     BP 03/05/24 1440 134/73     Girls Systolic BP Percentile --      Girls Diastolic BP Percentile --      Boys Systolic BP Percentile --      Boys Diastolic BP Percentile --      Pulse Rate 03/05/24 1440 85     Resp 03/05/24 1440 18     Temp 03/05/24 1440 98.9 F (37.2 C)     Temp Source 03/05/24 1440 Oral     SpO2 03/05/24 1440 96 %     Weight 03/05/24 1439 200 lb (90.7 kg)     Height 03/05/24 1439 5'  11 (1.803 m)     Head Circumference --      Peak Flow --      Pain Score 03/05/24 1438 5     Pain Loc --      Pain Education --      Exclude from Growth Chart --    No data found.  Updated Vital Signs BP 134/73 (BP Location: Right Arm)   Pulse 85   Temp 98.9 F (37.2 C) (Oral)   Resp 18   Ht 5' 11 (1.803 m)   Wt 90.7 kg   SpO2 96%   BMI 27.89 kg/m   Visual Acuity Right Eye Distance:   Left Eye Distance:   Bilateral Distance:    Right Eye Near:   Left Eye Near:    Bilateral Near:     Physical Exam Nursing notes and Vital Signs reviewed. Appearance:  Patient appears stated age, and in no acute distress Eyes:  Pupils are equal, round, and reactive to light and accomodation.  Extraocular movement is intact.  Conjunctivae are not inflamed  Ears:  Canals normal.  Tympanic membranes normal.  Nose:  Congested turbinates.  Maxillary sinus tenderness is present.  Pharynx:  Normal Neck:  Supple.  Mildly enlarged lateral nodes are present, tender to palpation on the left.   Lungs:  Clear to auscultation.  Breath sounds are equal.  Moving air well. Heart:  Regular rate and rhythm without murmurs, rubs, or gallops.  Abdomen:  Nontender without masses or hepatosplenomegaly.  Bowel sounds are present.  No CVA or flank tenderness.  Extremities:  No edema.  Skin:  No rash present.   UC Treatments / Results  Labs (all labs ordered are listed, but only abnormal results are displayed) Labs Reviewed - No data to display  EKG   Radiology No results found.  Procedures Procedures (including critical care time)  Medications Ordered in UC Medications - No data to display  Initial Impression / Assessment and Plan / UC Course  I have reviewed the triage vital signs and the nursing notes.  Pertinent labs & imaging results that were available during my care of the patient were reviewed by me and considered in my medical decision making (see chart for details).    Begin  doxycycline  100mg  BID for one week, and prednisone  burst/taper. Followup with ENT if not improved 7 to 10 days.  Final Clinical Impressions(s) / UC Diagnoses   Final diagnoses:  Acute recurrent maxillary sinusitis     Discharge Instructions      Take plain guaifenesin (1200mg  extended release tabs such as  Mucinex) twice daily, with plenty of water, for  congestion.  Get adequate rest.   May use Afrin nasal spray (or generic oxymetazoline) each morning for about 5 days and then discontinue.  Also recommend using saline nasal spray several times daily and saline nasal irrigation (AYR is a common brand).  Use Flonase nasal spray each morning after using Afrin nasal spray and saline nasal irrigation.      ED Prescriptions     Medication Sig Dispense Auth. Provider   doxycycline  (VIBRAMYCIN ) 100 MG capsule Take one cap PO Q12hr with food. 14 capsule Pauline Garnette LABOR, MD   predniSONE  (DELTASONE ) 20 MG tablet Take one tab by mouth twice daily for 4 days, then one daily. Take with food. 12 tablet Pauline Garnette LABOR, MD         Pauline Garnette LABOR, MD 03/06/24 [phone]

## 2024-03-05 NOTE — ED Triage Notes (Signed)
 Patient c/o sinus pressure and congestion, nasal drainage, HA, decreased appetite x 2 days.  Patient has taken Mucinex Max Strength q4hours.

## 2024-03-05 NOTE — Discharge Instructions (Signed)
Take plain guaifenesin (1200mg  extended release tabs such as Mucinex) twice daily, with plenty of water, for congestion.  Get adequate rest.   May use Afrin nasal spray (or generic oxymetazoline) each morning for about 5 days and then discontinue.  Also recommend using saline nasal spray several times daily and saline nasal irrigation (AYR is a common brand).  Use Flonase nasal spray each morning after using Afrin nasal spray and saline nasal irrigation.
